# Patient Record
Sex: Male | Born: 1985
Health system: Southern US, Community
[De-identification: ages and names within clinical notes are randomized; demographics above are authoritative.]

## PROBLEM LIST (undated history)

## (undated) DIAGNOSIS — J45909 Unspecified asthma, uncomplicated: Secondary | ICD-10-CM

---

## 2015-02-26 ENCOUNTER — Encounter (HOSPITAL_BASED_OUTPATIENT_CLINIC_OR_DEPARTMENT_OTHER): Payer: Self-pay

## 2015-02-26 ENCOUNTER — Emergency Department (HOSPITAL_BASED_OUTPATIENT_CLINIC_OR_DEPARTMENT_OTHER)
Admission: EM | Admit: 2015-02-26 | Discharge: 2015-02-26 | Disposition: A | Discharge status: Home / Self Care | Payer: BLUE CROSS/BLUE SHIELD | Attending: Emergency Medicine | Admitting: Emergency Medicine

## 2015-02-26 ENCOUNTER — Emergency Department (HOSPITAL_BASED_OUTPATIENT_CLINIC_OR_DEPARTMENT_OTHER): Payer: BLUE CROSS/BLUE SHIELD

## 2015-02-26 DIAGNOSIS — J45909 Unspecified asthma, uncomplicated: Secondary | ICD-10-CM | POA: Insufficient documentation

## 2015-02-26 DIAGNOSIS — M549 Dorsalgia, unspecified: Secondary | ICD-10-CM

## 2015-02-26 DIAGNOSIS — M546 Pain in thoracic spine: Secondary | ICD-10-CM | POA: Insufficient documentation

## 2015-02-26 DIAGNOSIS — Z720 Tobacco use: Secondary | ICD-10-CM | POA: Diagnosis not present

## 2015-02-26 DIAGNOSIS — M545 Low back pain, unspecified: Secondary | ICD-10-CM

## 2015-02-26 DIAGNOSIS — R079 Chest pain, unspecified: Secondary | ICD-10-CM | POA: Diagnosis not present

## 2015-02-26 HISTORY — DX: Unspecified asthma, uncomplicated: J45.909

## 2015-02-26 MED ORDER — NAPROXEN 250 MG PO TABS
500.0000 mg | ORAL_TABLET | Freq: Once | ORAL | Status: AC
  Administered 2015-02-26: 500 mg via ORAL
  Filled 2015-02-26: qty 2

## 2015-02-26 MED ORDER — NAPROXEN 500 MG PO TABS: 500.0000 mg | ORAL_TABLET | Freq: Two times a day (BID) | ORAL | Status: DC

## 2015-02-26 MED ORDER — CYCLOBENZAPRINE HCL 10 MG PO TABS: 10.0000 mg | ORAL_TABLET | Freq: Two times a day (BID) | ORAL | Status: DC | PRN

## 2015-02-26 NOTE — Discharge Instructions (Signed)
Take medications as directed. Rest for the next few days. Work note provided. Return for any new or worse symptoms.

## 2015-02-26 NOTE — ED Notes (Addendum)
Pt C/O of upper back pain for the past few days, radiates to shoulder/ side. Pain 8/10. Reports SOB

## 2015-02-26 NOTE — ED Notes (Signed)
Directed to pharmacy to pick up meds 

## 2015-02-26 NOTE — ED Provider Notes (Signed)
CSN: [id number]     Arrival date & time 02/26/15  1559 History   First MD Initiated Contact with Patient 02/26/15 1603     Chief Complaint  Patient presents with  . Back Pain     (Consider location/radiation/quality/duration/timing/severity/associated sxs/prior Treatment) Patient is a 29 y.o. male presenting with back pain. The history is provided by the patient.  Back Pain Associated symptoms: chest pain   Associated symptoms: no abdominal pain, no dysuria, no fever, no headaches, no numbness and no weakness    patient with complaint of a few day history of bilateral lower back pain no radiation into the legs no neurological changes. Also with pain to the medial aspect of the left scapula. Both together pain is about 8 out of 10 patient mentioned shortness of breath but no hypoxia and no tachycardia. No fevers. No history of any injury. No history of anything similar in the past. Patient's of left shoulder otherwise works well able to raise his arm above his head.  Past Medical History  Diagnosis Date  . Asthma    History reviewed. No pertinent past surgical history. History reviewed. No pertinent family history. Social History  Substance Use Topics  . Smoking status: Current Some Day Smoker  . Smokeless tobacco: None  . Alcohol Use: Yes    Review of Systems  Constitutional: Negative for fever.  HENT: Negative for congestion.   Eyes: Negative for visual disturbance.  Respiratory: Negative for shortness of breath.   Cardiovascular: Positive for chest pain.  Gastrointestinal: Negative for nausea, vomiting and abdominal pain.  Genitourinary: Negative for dysuria.  Musculoskeletal: Positive for back pain.  Skin: Negative for rash.  Neurological: Negative for weakness, numbness and headaches.  Hematological: Does not bruise/bleed easily.  Psychiatric/Behavioral: Negative for confusion.      Allergies  Review of patient's allergies indicates not on file.  Home Medications    Prior to Admission medications   Medication Sig Start Date End Date Taking? Authorizing Provider  cyclobenzaprine (FLEXERIL) 10 MG tablet Take 1 tablet (10 mg total) by mouth 2 (two) times daily as needed for muscle spasms. 02/26/15   Vanetta Mulders, MD  naproxen (NAPROSYN) 500 MG tablet Take 1 tablet (500 mg total) by mouth 2 (two) times daily. 02/26/15   Vanetta Mulders, MD   BP 128/67 mmHg  Pulse 68  Temp(Src) 98.2 F (36.8 C) (Oral)  Resp 18  Ht 5\' 9"  (1.753 m)  Wt 135 lb (61.236 kg)  BMI 19.93 kg/m2  SpO2 97% Physical Exam  Constitutional: He is oriented to person, place, and time. He appears well-developed and well-nourished. No distress.  HENT:  Head: Normocephalic and atraumatic.  Eyes: Conjunctivae and EOM are normal. Pupils are equal, round, and reactive to light.  Neck: Normal range of motion. Neck supple.  Cardiovascular: Normal rate, regular rhythm and normal heart sounds.   No murmur heard. Pulmonary/Chest: Effort normal and breath sounds normal. No respiratory distress.  Abdominal: Soft. Bowel sounds are normal. There is no tenderness.  Musculoskeletal: Normal range of motion. He exhibits tenderness.  Tender to palpation to the medial aspect of the left scapula. But no muscle spasm. No point tenderness along the thoracic or lumbar spine. No tenderness to palpation of the lumbar paraspinous muscles.  Neurological: He is alert and oriented to person, place, and time. No cranial nerve deficit. He exhibits normal muscle tone. Coordination normal.  Skin: Skin is warm. No rash noted.  Nursing note and vitals reviewed.   ED Course  Procedures (including critical care time) Labs Review Labs Reviewed - No data to display  Imaging Review Dg Chest 2 View  02/26/2015   CLINICAL DATA:  Complains of upper back and upper chest pain for few days. No injury.  EXAM: CHEST  2 VIEW  COMPARISON:  None.  FINDINGS: The heart size and mediastinal contours are within normal limits.  Both lungs are clear. The visualized skeletal structures are unremarkable.  IMPRESSION: No active cardiopulmonary disease.   Electronically Signed   By: Norva Pavlov M.D.   On: 02/26/2015 17:03   I have personally reviewed and evaluated these images and lab results as part of my medical decision-making.   EKG Interpretation None      MDM   Final diagnoses:  Bilateral low back pain without sciatica  Upper back pain on left side    Patient with a few day history of bilateral paraspinous lumbar back pain without any neuro focal deficits. No history of any injury. Also pain around the left upper back around the shoulder blade also without any injury. Chest x-ray was done just to be sure that the lungs were normal. Patient without any hypoxia no tachycardia no concerns for pulmonary embolus in that area. Chest x-ray was negative. Will treat with Flexeril and Naprosyn. Work note provided.    Vanetta Mulders, MD 02/26/15 [phone]

## 2015-08-20 ENCOUNTER — Emergency Department (HOSPITAL_BASED_OUTPATIENT_CLINIC_OR_DEPARTMENT_OTHER): Payer: BLUE CROSS/BLUE SHIELD

## 2015-08-20 ENCOUNTER — Emergency Department (HOSPITAL_BASED_OUTPATIENT_CLINIC_OR_DEPARTMENT_OTHER)
Admission: EM | Admit: 2015-08-20 | Discharge: 2015-08-20 | Disposition: A | Discharge status: Home / Self Care | Payer: BLUE CROSS/BLUE SHIELD | Attending: Emergency Medicine | Admitting: Emergency Medicine

## 2015-08-20 ENCOUNTER — Encounter (HOSPITAL_BASED_OUTPATIENT_CLINIC_OR_DEPARTMENT_OTHER): Payer: Self-pay | Admitting: Emergency Medicine

## 2015-08-20 DIAGNOSIS — J45901 Unspecified asthma with (acute) exacerbation: Secondary | ICD-10-CM | POA: Insufficient documentation

## 2015-08-20 DIAGNOSIS — Z791 Long term (current) use of non-steroidal anti-inflammatories (NSAID): Secondary | ICD-10-CM | POA: Insufficient documentation

## 2015-08-20 DIAGNOSIS — R0602 Shortness of breath: Secondary | ICD-10-CM | POA: Diagnosis present

## 2015-08-20 DIAGNOSIS — F17200 Nicotine dependence, unspecified, uncomplicated: Secondary | ICD-10-CM | POA: Insufficient documentation

## 2015-08-20 DIAGNOSIS — J209 Acute bronchitis, unspecified: Secondary | ICD-10-CM

## 2015-08-20 MED ORDER — ALBUTEROL SULFATE HFA 108 (90 BASE) MCG/ACT IN AERS: 2.0000 | INHALATION_SPRAY | RESPIRATORY_TRACT | Status: DC | PRN

## 2015-08-20 MED ORDER — IPRATROPIUM-ALBUTEROL 0.5-2.5 (3) MG/3ML IN SOLN
3.0000 mL | Freq: Once | RESPIRATORY_TRACT | Status: AC
  Administered 2015-08-20: 3 mL via RESPIRATORY_TRACT

## 2015-08-20 MED ORDER — IPRATROPIUM-ALBUTEROL 0.5-2.5 (3) MG/3ML IN SOLN
RESPIRATORY_TRACT | Status: AC
  Administered 2015-08-20: 3 mL via RESPIRATORY_TRACT
  Filled 2015-08-20: qty 3

## 2015-08-20 MED ORDER — ALBUTEROL SULFATE (2.5 MG/3ML) 0.083% IN NEBU
2.5000 mg | INHALATION_SOLUTION | Freq: Once | RESPIRATORY_TRACT | Status: AC
  Administered 2015-08-20: 2.5 mg via RESPIRATORY_TRACT

## 2015-08-20 MED ORDER — ALBUTEROL SULFATE HFA 108 (90 BASE) MCG/ACT IN AERS
2.0000 | INHALATION_SPRAY | Freq: Once | RESPIRATORY_TRACT | Status: AC
  Administered 2015-08-20: 2 via RESPIRATORY_TRACT
  Filled 2015-08-20: qty 6.7

## 2015-08-20 MED ORDER — DEXAMETHASONE 4 MG PO TABS
10.0000 mg | ORAL_TABLET | Freq: Once | ORAL | Status: AC
  Administered 2015-08-20: 10 mg via ORAL
  Filled 2015-08-20: qty 1

## 2015-08-20 MED ORDER — ALBUTEROL SULFATE (2.5 MG/3ML) 0.083% IN NEBU
INHALATION_SOLUTION | RESPIRATORY_TRACT | Status: AC
  Administered 2015-08-20: 2.5 mg via RESPIRATORY_TRACT
  Filled 2015-08-20: qty 3

## 2015-08-20 NOTE — ED Provider Notes (Signed)
CSN: [id number]     Arrival date & time 08/20/15  7829 History   First MD Initiated Contact with Patient 08/20/15 1002     No chief complaint on file.    (Consider location/radiation/quality/duration/timing/severity/associated sxs/prior Treatment) HPI  30 year old male presents with cough and dyspnea x 2 days. Chest occasionally hurts, mostly when coughing. Is having some sputum production. No fevers but felt hot yesterday. Denies congestion. Some sore throat when coughing. No headache. Patient states he has a history of asthma but has not had an asthma attack" years". Patient states he smokes occasionally but not daily.  Past Medical History  Diagnosis Date  . Asthma    No past surgical history on file. No family history on file. Social History  Substance Use Topics  . Smoking status: Current Some Day Smoker  . Smokeless tobacco: Not on file  . Alcohol Use: Yes    Review of Systems  Constitutional: Positive for fever (subjective).  HENT: Negative for congestion.   Respiratory: Positive for cough, chest tightness and shortness of breath.   Cardiovascular: Positive for chest pain.  All other systems reviewed and are negative.     Allergies  Review of patient's allergies indicates no known allergies.  Home Medications   Prior to Admission medications   Medication Sig Start Date End Date Taking? Authorizing Provider  cyclobenzaprine (FLEXERIL) 10 MG tablet Take 1 tablet (10 mg total) by mouth 2 (two) times daily as needed for muscle spasms. 02/26/15   Vanetta Mulders, MD  naproxen (NAPROSYN) 500 MG tablet Take 1 tablet (500 mg total) by mouth 2 (two) times daily. 02/26/15   Vanetta Mulders, MD   BP 124/76 mmHg  Pulse 66  Temp(Src) 99.1 F (37.3 C) (Oral)  Resp 16  Ht  (1.753 m)  Wt 145 lb (65.772 kg)  BMI 21.40 kg/m2  SpO2 100% Physical Exam  Constitutional: He is oriented to person, place, and time. He appears well-developed and well-nourished. No distress.   HENT:  Head: Normocephalic and atraumatic.  Right Ear: External ear normal.  Left Ear: External ear normal.  Nose: Nose normal.  Eyes: Right eye exhibits no discharge. Left eye exhibits no discharge.  Neck: Neck supple.  Cardiovascular: Normal rate, regular rhythm, normal heart sounds and intact distal pulses.   Pulmonary/Chest: Effort normal. He has wheezes (expiratory wheezes, symmetric). He exhibits no tenderness.  Speaks in full sentences, no respiratory distress  Abdominal: Soft. He exhibits no distension. There is no tenderness.  Musculoskeletal: He exhibits no edema.  Neurological: He is alert and oriented to person, place, and time.  Skin: Skin is warm and dry. He is not diaphoretic.  Nursing note and vitals reviewed.   ED Course  Procedures (including critical care time) Labs Review Labs Reviewed - No data to display  Imaging Review Dg Chest 2 View  08/20/2015  CLINICAL DATA:  30 year old male with cough congestion and wheezing for 2 days. Initial encounter. EXAM: CHEST  2 VIEW COMPARISON:  02/26/2015 FINDINGS: Upper limits of normal to mildly hyperinflated lungs. Normal cardiac size and mediastinal contours. Visualized tracheal air column is within normal limits. No pneumothorax, pulmonary edema, pleural effusion or confluent pulmonary opacity. Mildly increased perihilar interstitial markings. No osseous abnormality identified. IMPRESSION: Mild hyperinflation and increased perihilar interstitial markings. Favor viral versus reactive airway disease. Electronically Signed   By: Odessa Fleming M.D.   On: 08/20/2015 10:43   I have personally reviewed and evaluated these images and lab results as part of my  medical decision-making.   EKG Interpretation None      MDM   Final diagnoses:  Acute bronchitis, unspecified organism    Patient symptoms are consistent with an acute bronchitis. Most likely from a viral illness. He appears well here, no increased work of breathing or  hypoxia. Feels somewhat better after an albuterol nebulizer. Chest x-ray shows no pneumonia. He has a history of asthma but has not had an asthma exacerbation in several years. Given there could be a bronchospasm component from asthma, will be given one dose of Decadron. Will discharge with an albuterol inhaler and discussed supportive care is likely a viral illness. Discussed return precautions.    Pricilla Loveless, MD 08/20/15 1153

## 2015-08-20 NOTE — ED Notes (Signed)
Sob for two days.  Pt having chest pain with coughing.  Productive brown sputum. No known fever.  Some hot spells with sweats.

## 2015-08-21 NOTE — ED Notes (Signed)
Pt called requesting work note. Seen in ED yesterday, Chart reviewed by Dr. Silverio Lay and work note faxed per pt request to [phone]

## 2015-10-29 ENCOUNTER — Encounter (HOSPITAL_BASED_OUTPATIENT_CLINIC_OR_DEPARTMENT_OTHER): Payer: Self-pay | Admitting: *Deleted

## 2015-10-29 ENCOUNTER — Emergency Department (HOSPITAL_BASED_OUTPATIENT_CLINIC_OR_DEPARTMENT_OTHER)
Admission: EM | Admit: 2015-10-29 | Discharge: 2015-10-29 | Disposition: A | Discharge status: Home / Self Care | Payer: BLUE CROSS/BLUE SHIELD | Attending: Emergency Medicine | Admitting: Emergency Medicine

## 2015-10-29 DIAGNOSIS — F17210 Nicotine dependence, cigarettes, uncomplicated: Secondary | ICD-10-CM | POA: Diagnosis not present

## 2015-10-29 DIAGNOSIS — Z7951 Long term (current) use of inhaled steroids: Secondary | ICD-10-CM | POA: Diagnosis not present

## 2015-10-29 DIAGNOSIS — J069 Acute upper respiratory infection, unspecified: Secondary | ICD-10-CM | POA: Insufficient documentation

## 2015-10-29 DIAGNOSIS — J45909 Unspecified asthma, uncomplicated: Secondary | ICD-10-CM | POA: Diagnosis not present

## 2015-10-29 DIAGNOSIS — J029 Acute pharyngitis, unspecified: Secondary | ICD-10-CM | POA: Diagnosis present

## 2015-10-29 LAB — RAPID STREP SCREEN (MED CTR MEBANE ONLY): Streptococcus, Group A Screen (Direct): NEGATIVE

## 2015-10-29 MED ORDER — FLUTICASONE PROPIONATE 50 MCG/ACT NA SUSP: 2.0000 | Freq: Every day | NASAL | Status: DC

## 2015-10-29 MED ORDER — NAPROXEN 250 MG PO TABS: 250.0000 mg | ORAL_TABLET | Freq: Two times a day (BID) | ORAL | Status: DC

## 2015-10-29 MED ORDER — BENZONATATE 100 MG PO CAPS: 100.0000 mg | ORAL_CAPSULE | Freq: Three times a day (TID) | ORAL | Status: DC | PRN

## 2015-10-29 MED ORDER — CETIRIZINE HCL 10 MG PO TABS: 10.0000 mg | ORAL_TABLET | Freq: Every day | ORAL | Status: DC

## 2015-10-29 NOTE — Discharge Instructions (Signed)
Upper Respiratory Infection, Adult Most upper respiratory infections (URIs) are a viral infection of the air passages leading to the lungs. A URI affects the nose, throat, and upper air passages. The most common type of URI is nasopharyngitis and is typically referred to as "the common cold." URIs run their course and usually go away on their own. Most of the time, a URI does not require medical attention, but sometimes a bacterial infection in the upper airways can follow a viral infection. This is called a secondary infection. Sinus and middle ear infections are common types of secondary upper respiratory infections. Bacterial pneumonia can also complicate a URI. A URI can worsen asthma and chronic obstructive pulmonary disease (COPD). Sometimes, these complications can require emergency medical care and may be life threatening.  CAUSES Almost all URIs are caused by viruses. A virus is a type of germ and can spread from one person to another.  RISKS FACTORS You may be at risk for a URI if:   You smoke.   You have chronic heart or lung disease.  You have a weakened defense (immune) system.   You are very young or very old.   You have nasal allergies or asthma.  You work in crowded or poorly ventilated areas.  You work in health care facilities or schools. SIGNS AND SYMPTOMS  Symptoms typically develop 2-3 days after you come in contact with a cold virus. Most viral URIs last 7-10 days. However, viral URIs from the influenza virus (flu virus) can last 14-18 days and are typically more severe. Symptoms may include:   Runny or stuffy (congested) nose.   Sneezing.   Cough.   Sore throat.   Headache.   Fatigue.   Fever.   Loss of appetite.   Pain in your forehead, behind your eyes, and over your cheekbones (sinus pain).  Muscle aches.  DIAGNOSIS  Your health care provider may diagnose a URI by:  Physical exam.  Tests to check that your symptoms are not due to  another condition such as:  Strep throat.  Sinusitis.  Pneumonia.  Asthma. TREATMENT  A URI goes away on its own with time. It cannot be cured with medicines, but medicines may be prescribed or recommended to relieve symptoms. Medicines may help:  Reduce your fever.  Reduce your cough.  Relieve nasal congestion. HOME CARE INSTRUCTIONS   Take medicines only as directed by your health care provider.   Gargle warm saltwater or take cough drops to comfort your throat as directed by your health care provider.  Use a warm mist humidifier or inhale steam from a shower to increase air moisture. This may make it easier to breathe.  Drink enough fluid to keep your urine clear or pale yellow.   Eat soups and other clear broths and maintain good nutrition.   Rest as needed.   Return to work when your temperature has returned to normal or as your health care provider advises. You may need to stay home longer to avoid infecting others. You can also use a face mask and careful hand washing to prevent spread of the virus.  Increase the usage of your inhaler if you have asthma.   Do not use any tobacco products, including cigarettes, chewing tobacco, or electronic cigarettes. If you need help quitting, ask your health care provider. PREVENTION  The best way to protect yourself from getting a cold is to practice good hygiene.   Avoid oral or hand contact with people with cold   symptoms.   Wash your hands often if contact occurs.  There is no clear evidence that vitamin C, vitamin E, echinacea, or exercise reduces the chance of developing a cold. However, it is always recommended to get plenty of rest, exercise, and practice good nutrition.  SEEK MEDICAL CARE IF:   You are getting worse rather than better.   Your symptoms are not controlled by medicine.   You have chills.  You have worsening shortness of breath.  You have brown or red mucus.  You have yellow or brown nasal  discharge.  You have pain in your face, especially when you bend forward.  You have a fever.  You have swollen neck glands.  You have pain while swallowing.  You have white areas in the back of your throat. SEEK IMMEDIATE MEDICAL CARE IF:   You have severe or persistent:  Headache.  Ear pain.  Sinus pain.  Chest pain.  You have chronic lung disease and any of the following:  Wheezing.  Prolonged cough.  Coughing up blood.  A change in your usual mucus.  You have a stiff neck.  You have changes in your:  Vision.  Hearing.  Thinking.  Mood. MAKE SURE YOU:   Understand these instructions.  Will watch your condition.  Will get help right away if you are not doing well or get worse.   This information is not intended to replace advice given to you by your health care provider. Make sure you discuss any questions you have with your health care provider.   Document Released: 12/08/2000 Document Revised: 10/29/2014 Document Reviewed: 09/19/2013 Elsevier Interactive Patient Education 2016 [employer]  

## 2015-10-29 NOTE — ED Provider Notes (Signed)
CSN: [id number]     Arrival date & time 10/29/15  0902 History   First MD Initiated Contact with Patient 10/29/15 [phone]     Chief Complaint  Patient presents with  . Sore Throat    Derek Moreno is a 30 y.o. male who presents to the ED complaining of a sore throat, nasal congestion, post nasal drip, and coughing for the past three days. He denies fevers. He also reports his urine stream is initally "spraying" and he will get some on the sides of the toilet. He reports once he starts his urine stream it will normalize and is his normal urine stream. He denies penile pain or discharge. He denies dysuria. He has taken nothing for treatment today. He denies fevers, SOB, abdominal pain, nausea, diarrhea, dysuria, hematuria, rashes, flank pain, testicle pain, rashes.    Patient is a 30 y.o. male presenting with pharyngitis. The history is provided by the patient. No language interpreter was used.  Sore Throat Associated symptoms include congestion, coughing and a sore throat. Pertinent negatives include no abdominal pain, chest pain, chills, fever, headaches, nausea, neck pain or rash.    Past Medical History  Diagnosis Date  . Asthma    History reviewed. No pertinent past surgical history. No family history on file. Social History  Substance Use Topics  . Smoking status: Current Some Day Smoker -- 1.00 packs/day    Types: Cigarettes, Cigars  . Smokeless tobacco: Never Used  . Alcohol Use: Yes    Review of Systems  Constitutional: Negative for fever and chills.  HENT: Positive for congestion, postnasal drip, rhinorrhea and sore throat. Negative for ear discharge, ear pain and trouble swallowing.   Eyes: Negative for visual disturbance.  Respiratory: Positive for cough. Negative for shortness of breath and wheezing.   Cardiovascular: Negative for chest pain.  Gastrointestinal: Negative for nausea, abdominal pain and diarrhea.  Genitourinary: Negative for dysuria, urgency, frequency,  hematuria, flank pain, decreased urine volume, discharge, penile swelling, scrotal swelling, difficulty urinating, penile pain and testicular pain.  Musculoskeletal: Negative for back pain and neck pain.  Skin: Negative for rash.  Neurological: Negative for headaches.      Allergies  Review of patient's allergies indicates no known allergies.  Home Medications   Prior to Admission medications   Medication Sig Start Date End Date Taking? Authorizing Provider  albuterol (PROVENTIL HFA;VENTOLIN HFA) 108 (90 Base) MCG/ACT inhaler Inhale 2 puffs into the lungs every 4 (four) hours as needed for wheezing or shortness of breath. 08/20/15  Yes Pricilla Loveless, MD  benzonatate (TESSALON) 100 MG capsule Take 1 capsule (100 mg total) by mouth 3 (three) times daily as needed for cough. 10/29/15   Everlene Farrier, PA-C  cetirizine (ZYRTEC ALLERGY) 10 MG tablet Take 1 tablet (10 mg total) by mouth daily. 10/29/15   Everlene Farrier, PA-C  fluticasone (FLONASE) 50 MCG/ACT nasal spray Place 2 sprays into both nostrils daily. 10/29/15   Everlene Farrier, PA-C  naproxen (NAPROSYN) 250 MG tablet Take 1 tablet (250 mg total) by mouth 2 (two) times daily with a meal. 10/29/15   Everlene Farrier, PA-C   BP 153/92 mmHg  Pulse 62  Temp(Src) 98.2 F (36.8 C) (Oral)  Resp 18  Ht 5\' 9"  (1.753 m)  Wt 61.236 kg  BMI 19.93 kg/m2  SpO2 100% Physical Exam  Constitutional: He appears well-developed and well-nourished. No distress.  Nontoxic appearing.  HENT:  Head: Normocephalic and atraumatic.  Right Ear: External ear normal.  Left Ear: External  ear normal.  Mouth/Throat: Oropharynx is clear and moist.  Bilateral tympanic membranes are pearly-gray without erythema or loss of landmarks.  Boggy nasal turbinates bilaterally. Mild bilateral tonsillar hypertrophy without exudates. Uvula is midline without edema. No peritonsillar abscess. No trismus. No drooling.  Eyes: Conjunctivae and EOM are normal. Pupils are equal, round,  and reactive to light. Right eye exhibits no discharge. Left eye exhibits no discharge.  Neck: Normal range of motion. Neck supple. No JVD present.  Cardiovascular: Normal rate, regular rhythm, normal heart sounds and intact distal pulses.  Exam reveals no gallop and no friction rub.   No murmur heard. Pulmonary/Chest: Effort normal and breath sounds normal. No stridor. No respiratory distress. He has no wheezes. He has no rales.  Lungs are clear to auscultation bilaterally.  Abdominal: Soft. Bowel sounds are normal. He exhibits no distension. There is no tenderness. There is no guarding.  Genitourinary: Penis normal. No penile tenderness.  No penile tenderness or discharge. Meatus is open. Scrotal contents is normal. No lesions or rashes.   Musculoskeletal: He exhibits no edema or tenderness.  Lymphadenopathy:    He has no cervical adenopathy.  Neurological: He is alert. Coordination normal.  Skin: Skin is warm and dry. No rash noted. He is not diaphoretic. No erythema. No pallor.  Psychiatric: He has a normal mood and affect. His behavior is normal.  Nursing note and vitals reviewed.   ED Course  Procedures (including critical care time) Labs Review Labs Reviewed  RAPID STREP SCREEN (NOT AT [facility])  CULTURE, GROUP A STREP [facility])    Imaging Review No results found. I have personally reviewed and evaluated these lab results as part of my medical decision-making.   EKG Interpretation None     Filed Vitals:   10/29/15 0916  BP: 153/92  Pulse: 62  Temp: 98.2 F (36.8 C)  TempSrc: Oral  Resp: 18  Height:  (1.753 m)  Weight: 61.236 kg  SpO2: 100%    MDM   Meds given in ED:  Medications - No data to display  New Prescriptions   BENZONATATE (TESSALON) 100 MG CAPSULE    Take 1 capsule (100 mg total) by mouth 3 (three) times daily as needed for cough.   CETIRIZINE (ZYRTEC ALLERGY) 10 MG TABLET    Take 1 tablet (10 mg total) by mouth daily.   FLUTICASONE (FLONASE) 50  MCG/ACT NASAL SPRAY    Place 2 sprays into both nostrils daily.   NAPROXEN (NAPROSYN) 250 MG TABLET    Take 1 tablet (250 mg total) by mouth 2 (two) times daily with a meal.    Final diagnoses:  URI (upper respiratory infection)   This is a 30 y.o. male who presents to the ED complaining of a sore throat, nasal congestion, post nasal drip, and coughing for the past three days. He denies fevers. He also reports his urine stream is initally "spraying" and he will get some on the sides of the toilet. He reports once he starts his urine stream it will normalize and is his normal urine stream. He denies penile pain or discharge. He denies dysuria. He has taken nothing for treatment today. On examination the patient is afebrile nontoxic appearing. He has mild bilateral tonsillar protrusion without exudates. Boggy nasal turbinates bilaterally. Lungs are clear to auscultation bilaterally. Abdomen is soft and nontender. GU exam is unremarkable. Meatus is open. No discharge or tenderness. I suspect that the patient's meatus is sticking together briefly and then his stream normalizes.  I educated on keeping his penis clean. He is circumcised. I encouraged him to follow up with urology if his symptoms persist. Rapid strep is negative. Patient with upper respiratory infection. We'll discharge with prescriptions for Tessalon Perles, Zyrtec, Flonase and naproxen. I encouraged him to push fluids. I discussed return precautions. I advised the patient to follow-up with their primary care provider this week. I advised the patient to return to the emergency department with new or worsening symptoms or new concerns. The patient verbalized understanding and agreement with plan.      Everlene FarrierWilliam Kamaljit Hizer, PA-C 10/29/15 1024  Marily MemosJason Mesner, MD 10/30/15 1520

## 2015-10-29 NOTE — ED Notes (Signed)
Patient states he has a three day history of sinus congestion, sore throat, and productive cough with greenish secretions.  Unsure of a temperature.

## 2015-11-01 LAB — CULTURE, GROUP A STREP (THRC)

## 2015-11-12 ENCOUNTER — Emergency Department (HOSPITAL_BASED_OUTPATIENT_CLINIC_OR_DEPARTMENT_OTHER): Payer: BLUE CROSS/BLUE SHIELD

## 2015-11-12 ENCOUNTER — Emergency Department (HOSPITAL_BASED_OUTPATIENT_CLINIC_OR_DEPARTMENT_OTHER)
Admission: EM | Admit: 2015-11-12 | Discharge: 2015-11-13 | Disposition: A | Discharge status: Other Acute Inpatient Hospital | Payer: BLUE CROSS/BLUE SHIELD | Attending: Emergency Medicine | Admitting: Emergency Medicine

## 2015-11-12 ENCOUNTER — Encounter (HOSPITAL_BASED_OUTPATIENT_CLINIC_OR_DEPARTMENT_OTHER): Payer: Self-pay | Admitting: Emergency Medicine

## 2015-11-12 DIAGNOSIS — F17210 Nicotine dependence, cigarettes, uncomplicated: Secondary | ICD-10-CM | POA: Diagnosis not present

## 2015-11-12 DIAGNOSIS — R10A1 Flank pain, right side: Secondary | ICD-10-CM | POA: Diagnosis present

## 2015-11-12 DIAGNOSIS — R1031 Right lower quadrant pain: Secondary | ICD-10-CM | POA: Insufficient documentation

## 2015-11-12 DIAGNOSIS — K389 Disease of appendix, unspecified: Secondary | ICD-10-CM | POA: Diagnosis present

## 2015-11-12 DIAGNOSIS — J45909 Unspecified asthma, uncomplicated: Secondary | ICD-10-CM | POA: Diagnosis not present

## 2015-11-12 DIAGNOSIS — R109 Unspecified abdominal pain: Secondary | ICD-10-CM | POA: Diagnosis present

## 2015-11-12 DIAGNOSIS — R300 Dysuria: Secondary | ICD-10-CM | POA: Diagnosis not present

## 2015-11-12 DIAGNOSIS — R319 Hematuria, unspecified: Secondary | ICD-10-CM | POA: Insufficient documentation

## 2015-11-12 DIAGNOSIS — M79651 Pain in right thigh: Secondary | ICD-10-CM | POA: Diagnosis present

## 2015-11-12 DIAGNOSIS — Z720 Tobacco use: Secondary | ICD-10-CM | POA: Diagnosis present

## 2015-11-12 LAB — BASIC METABOLIC PANEL
Anion gap: 9 (ref 5–15)
BUN: 12 mg/dL (ref 6–20)
CHLORIDE: 101 mmol/L (ref 101–111)
CO2: 26 mmol/L (ref 22–32)
CREATININE: 1.12 mg/dL (ref 0.61–1.24)
Calcium: 9.1 mg/dL (ref 8.9–10.3)
GFR calc non Af Amer: >60 mL/min (ref >60)
Glucose, Bld: 78 mg/dL (ref 65–99)
POTASSIUM: 3.6 mmol/L (ref 3.5–5.1)
SODIUM: 136 mmol/L (ref 135–145)

## 2015-11-12 LAB — CBC WITH DIFFERENTIAL/PLATELET
BASOS ABS: 0 10*3/uL (ref 0.0–0.1)
Basophils Relative: 0 %
EOS ABS: 0.1 10*3/uL (ref 0.0–0.7)
Eosinophils Relative: 2 %
HCT: 40.2 % (ref 39.0–52.0)
HEMOGLOBIN: 13.6 g/dL (ref 13.0–17.0)
LYMPHS ABS: 2.7 10*3/uL (ref 0.7–4.0)
Lymphocytes Relative: 32 %
MCH: 28.8 pg (ref 26.0–34.0)
MCHC: 33.8 g/dL (ref 30.0–36.0)
MCV: 85 fL (ref 78.0–100.0)
Monocytes Absolute: 0.7 10*3/uL (ref 0.1–1.0)
Monocytes Relative: 8 %
NEUTROS PCT: 58 %
Neutro Abs: 5 10*3/uL (ref 1.7–7.7)
Platelets: 279 10*3/uL (ref 150–400)
RBC: 4.73 MIL/uL (ref 4.22–5.81)
RDW: 13.5 % (ref 11.5–15.5)
WBC: 8.6 10*3/uL (ref 4.0–10.5)

## 2015-11-12 LAB — URINALYSIS, ROUTINE W REFLEX MICROSCOPIC
BILIRUBIN URINE: NEGATIVE
GLUCOSE, UA: NEGATIVE mg/dL
KETONES UR: NEGATIVE mg/dL
Leukocytes, UA: NEGATIVE
Nitrite: NEGATIVE
PH: 6 (ref 5.0–8.0)
Protein, ur: NEGATIVE mg/dL
Specific Gravity, Urine: 1.04 — ABNORMAL HIGH (ref 1.005–1.030)

## 2015-11-12 LAB — URINE MICROSCOPIC-ADD ON

## 2015-11-12 MED ORDER — OXYCODONE-ACETAMINOPHEN 5-325 MG PO TABS: 1.0000 | ORAL_TABLET | Freq: Four times a day (QID) | ORAL | Status: DC | PRN

## 2015-11-12 MED ORDER — KETOROLAC TROMETHAMINE 30 MG/ML IJ SOLN
30.0000 mg | Freq: Once | INTRAMUSCULAR | Status: AC
  Administered 2015-11-12: 30 mg via INTRAVENOUS
  Filled 2015-11-12: qty 1

## 2015-11-12 MED ORDER — FENTANYL CITRATE (PF) 100 MCG/2ML IJ SOLN
50.0000 ug | Freq: Once | INTRAMUSCULAR | Status: AC
  Administered 2015-11-12: 50 ug via INTRAVENOUS
  Filled 2015-11-12: qty 2

## 2015-11-12 NOTE — Discharge Instructions (Signed)
Managing Pain  Pain after surgery or related to activity is often due to strain/injury to muscle, tendon, nerves and/or incisions.  This pain is usually short-term and will improve in a few months.   Many people find it helpful to do the following things TOGETHER to help speed the process of healing and to get back to regular activity more quickly:  1. Avoid heavy physical activity at first a. No lifting greater than 20 pounds at first, then increase to lifting as tolerated over the next few weeks b. Do not push through the pain.  Listen to your body and avoid positions and maneuvers than reproduce the pain.  Wait a few days before trying something more intense c. Walking is okay as tolerated, but go slowly and stop when getting sore.  If you can walk 30 minutes without stopping or pain, you can try more intense activity (running, jogging, aerobics, cycling, swimming, treadmill, sex, sports, weightlifting, etc ) d. Remember: If it hurts to do it, then dont do it!  2. Take Anti-inflammatory medication a. Choose ONE of the following over-the-counter medications: i.            Acetaminophen 500mg  tabs (Tylenol) 1-2 pills with every meal and just before bedtime (avoid if you have liver problems) ii.            Naproxen 220mg  tabs (ex. Aleve) 1-2 pills twice a day (avoid if you have kidney, stomach, IBD, or bleeding problems) iii. Ibuprofen 200mg  tabs (ex. Advil, Motrin) 3-4 pills with every meal and just before bedtime (avoid if you have kidney, stomach, IBD, or bleeding problems) b. Take with food/snack around the clock for 1-2 weeks i. This helps the muscle and nerve tissues become less irritable and calm down faster  3. Use a Heating pad or Ice/Cold Pack a. 4-6 times a day b. May use warm bath/hottub  or showers  4. Try Gentle Massage and/or Stretching  a. at the area of pain many times a day b. stop if you feel pain - do not overdo it  Try these steps together to help you body heal  faster and avoid making things get worse.  Doing just one of these things may not be enough.    If you are not getting better after two weeks or are noticing you are getting worse, contact our office for further advice; we may need to re-evaluate you & see what other things we can do to help.   Flank Pain Flank pain refers to pain that is located on the side of the body between the upper abdomen and the back. The pain may occur over a short period of time (acute) or may be long-term or reoccurring (chronic). It may be mild or severe. Flank pain can be caused by many things. CAUSES  Some of the more common causes of flank pain include:  Muscle strains.   Muscle spasms.   A disease of your spine (vertebral disk disease).   A lung infection (pneumonia).   Fluid around your lungs (pulmonary edema).   A kidney infection.   Kidney stones.   A very painful skin rash caused by the chickenpox virus (shingles).   Gallbladder disease.  HOME CARE INSTRUCTIONS  Home care will depend on the cause of your pain. In general,  Rest as directed by your caregiver.  Drink enough fluids to keep your urine clear or pale yellow.  Only take over-the-counter or prescription medicines as directed by your caregiver. Some medicines  may help relieve the pain.  Tell your caregiver about any changes in your pain.  Follow up with your caregiver as directed. SEEK IMMEDIATE MEDICAL CARE IF:   Your pain is not controlled with medicine.   You have new or worsening symptoms.  Your pain increases.   You have abdominal pain.   You have shortness of breath.   You have persistent nausea or vomiting.   You have swelling in your abdomen.   You feel faint or pass out.   You have blood in your urine.  You have a fever or persistent symptoms for more than 2-3 days.  You have a fever and your symptoms suddenly get worse. MAKE SURE YOU:   Understand these instructions.  Will watch  your condition.  Will get help right away if you are not doing well or get worse.   This information is not intended to replace advice given to you by your health care provider. Make sure you discuss any questions you have with your health care provider.   Document Released: 08/05/2005 Document Revised: 03/08/2012 Document Reviewed: 01/27/2012 Elsevier Interactive Patient Education 2016 [employer]  Hematuria, Adult Hematuria is blood in your urine. It can be caused by a bladder infection, kidney infection, prostate infection, kidney stone, or cancer of your urinary tract. Infections can usually be treated with medicine, and a kidney stone usually will pass through your urine. If neither of these is the cause of your hematuria, further workup to find out the reason may be needed. It is very important that you tell your health care provider about any blood you see in your urine, even if the blood stops without treatment or happens without causing pain. Blood in your urine that happens and then stops and then happens again can be a symptom of a very serious condition. Also, pain is not a symptom in the initial stages of many urinary cancers. HOME CARE INSTRUCTIONS   Drink lots of fluid, 3-4 quarts a day. If you have been diagnosed with an infection, cranberry juice is especially recommended, in addition to large amounts of water.  Avoid caffeine, tea, and carbonated beverages because they tend to irritate the bladder.  Avoid alcohol because it may irritate the prostate.  Take all medicines as directed by your health care provider.  If you were prescribed an antibiotic medicine, finish it all even if you start to feel better.  If you have been diagnosed with a kidney stone, follow your health care provider's instructions regarding straining your urine to catch the stone.  Empty your bladder often. Avoid holding urine for long periods of time.  After a bowel movement, women should cleanse  front to back. Use each tissue only once.  Empty your bladder before and after sexual intercourse if you are a male. SEEK MEDICAL CARE IF:  You develop back pain.  You have a fever.  You have a feeling of sickness in your stomach (nausea) or vomiting.  Your symptoms are not better in 3 days. Return sooner if you are getting worse. SEEK IMMEDIATE MEDICAL CARE IF:   You develop severe vomiting and are unable to keep the medicine down.  You develop severe back or abdominal pain despite taking your medicines.  You begin passing a large amount of blood or clots in your urine.  You feel extremely weak or faint, or you pass out. MAKE SURE YOU:   Understand these instructions.  Will watch your condition.  Will get help right away  if you are not doing well or get worse.   This information is not intended to replace advice given to you by your health care provider. Make sure you discuss any questions you have with your health care provider.   Document Released: 06/14/2005 Document Revised: 07/05/2014 Document Reviewed: 02/12/2013 Elsevier Interactive Patient Education [employer]

## 2015-11-12 NOTE — ED Provider Notes (Addendum)
  Physical Exam  BP 134/92 mmHg  Pulse 67  Temp(Src) 97.8 F (36.6 C) (Oral)  Resp 18  Ht 5\' 9"  (1.753 m)  Wt 145 lb (65.772 kg)  BMI 21.40 kg/m2  SpO2 98%  Physical Exam  ED Course  Procedures  MDM Transferred from [facility] to see Dr. gross from general surgery. CT scan with some mild appendix changes. Patient still complaining of pain but not very tender at this time. To be seen in the ER by general surgery.      Benjiman CoreNathan Toran Murch, MD 11/12/15 2241  Seen by Dr. gross who believes this is muscle skeletal pain. Did have hematuria. Will have follow-up with urology as needed. Pain medicines given here.  Benjiman CoreNathan Savir Blanke, MD 11/12/15 [phone]

## 2015-11-12 NOTE — ED Provider Notes (Signed)
CSN: [id number]     Arrival date & time 11/12/15  1737 History  By signing my name below, I, [facility] LL[facility]issa Washington, attest that this documentation has been prepared under the direction and in the presence of Geoffery Lyonsouglas Tiffinie Caillier, MD. Electronically Signed: Randell PatientMarrissa Washington, ED Scribe. 11/12/2015. 8:19 PM.   Chief Complaint  Patient presents with  . Dysuria  . Abdominal Pain   HPI Comments: HPI Comments: Derek Moreno is a 30 y.o. Male with an hx of asthma who presents to the Emergency Department complaining of constant, mild, RLQ bdominal pain onset 3 days ago. Pt states that his symptoms began suddenly without injury. He reports dysuria for the. Pain is unchanged with palpation and percussion. He notes that he is currently sexually active with one new partner. Denies hx of kidney stones. Denies similar symptoms in the apst. Denies fever, bowel incontinence, penile discharge.   Patient is a 30 y.o. male presenting with dysuria and abdominal pain. The history is provided by the patient. No language interpreter was used.  Dysuria This is a new problem. The current episode started more than 2 days ago. The problem has not changed since onset.Associated symptoms include abdominal pain. Nothing aggravates the symptoms. Nothing relieves the symptoms. He has tried nothing for the symptoms.  Abdominal Pain Pain location:  R flank Pain radiates to:  Does not radiate Pain severity:  Mild Onset quality:  Gradual Timing:  Constant Progression:  Unchanged Chronicity:  New Context: recent sexual activity   Relieved by:  None tried Worsened by:  Nothing tried Associated symptoms: dysuria   Associated symptoms: no fever     Past Medical History  Diagnosis Date  . Asthma    History reviewed. No pertinent past surgical history. History reviewed. No pertinent family history. Social History  Substance Use Topics  . Smoking status: Current Some Day Smoker -- 1.00 packs/day    Types: Cigarettes, Cigars  .  Smokeless tobacco: Never Used  . Alcohol Use: Yes    Review of Systems  Constitutional: Negative for fever.  Gastrointestinal: Positive for abdominal pain.  Genitourinary: Positive for dysuria and flank pain. Negative for discharge.  All other systems reviewed and are negative.  Allergies  Review of patient's allergies indicates no known allergies.  Home Medications   Prior to Admission medications   Medication Sig Start Date End Date Taking? Authorizing Provider  albuterol (PROVENTIL HFA;VENTOLIN HFA) 108 (90 Base) MCG/ACT inhaler Inhale 2 puffs into the lungs every 4 (four) hours as needed for wheezing or shortness of breath. 08/20/15   Pricilla LovelessScott Goldston, MD  benzonatate (TESSALON) 100 MG capsule Take 1 capsule (100 mg total) by mouth 3 (three) times daily as needed for cough. 10/29/15   Everlene FarrierWilliam Dansie, PA-C  cetirizine (ZYRTEC ALLERGY) 10 MG tablet Take 1 tablet (10 mg total) by mouth daily. 10/29/15   Everlene FarrierWilliam Dansie, PA-C  fluticasone (FLONASE) 50 MCG/ACT nasal spray Place 2 sprays into both nostrils daily. 10/29/15   Everlene FarrierWilliam Dansie, PA-C  naproxen (NAPROSYN) 250 MG tablet Take 1 tablet (250 mg total) by mouth 2 (two) times daily with a meal. 10/29/15   Everlene FarrierWilliam Dansie, PA-C   BP 134/92 mmHg  Pulse 67  Temp(Src) 97.8 F (36.6 C) (Oral)  Resp 18  Ht 5\' 9"  (1.753 m)  Wt 145 lb (65.772 kg)  BMI 21.40 kg/m2  SpO2 98% Physical Exam  Constitutional: He is oriented to person, place, and time. He appears well-developed and well-nourished. No distress.  HENT:  Head: Normocephalic and atraumatic.  Eyes: Conjunctivae are normal.  Neck: Normal range of motion.  Cardiovascular: Normal rate.   Pulmonary/Chest: Effort normal. No respiratory distress.  Abdominal: Soft. Bowel sounds are normal. He exhibits no distension. There is no tenderness.  Musculoskeletal: Normal range of motion.  Neurological: He is alert and oriented to person, place, and time.  Skin: Skin is warm and dry.  Psychiatric: He  has a normal mood and affect. His behavior is normal.  Nursing note and vitals reviewed.   ED Course  Procedures  DIAGNOSTIC STUDIES: Oxygen Saturation is 100% on RA, normal by my interpretation.    COORDINATION OF CARE: 6:06 PM Will return to discuss results of labs. Discussed treatment plan with pt at bedside and pt agreed to plan.  6:40 PM Reviewed lab results. Will order renal stone study CT. Will return to inform pt of treatment plan.  7:37 PM Returned to discuss results of CT imaging. Ordered more labs and will consult with General Surgery.  8:17 PM Consulted with General Surgery.   Labs Review Labs Reviewed  URINALYSIS, ROUTINE W REFLEX MICROSCOPIC (NOT AT [facility]) - Abnormal; Notable for the following:    Specific Gravity, Urine 1.040 (*)    Hgb urine dipstick MODERATE (*)    All other components within normal limits  URINE MICROSCOPIC-ADD ON - Abnormal; Notable for the following:    Squamous Epithelial / LPF 0-5 (*)    Bacteria, UA RARE (*)    All other components within normal limits  BASIC METABOLIC PANEL  CBC WITH DIFFERENTIAL/PLATELET    Imaging Review Ct Renal Stone Study  11/12/2015  CLINICAL DATA:  RIGHT flank pain for 3 days, dysuria, asthma, smoker, initial encounter EXAM: CT ABDOMEN AND PELVIS WITHOUT CONTRAST TECHNIQUE: Multidetector CT imaging of the abdomen and pelvis was performed following the standard protocol without IV contrast. Sagittal and coronal MPR images reconstructed from axial data set. Oral contrast not administered for this indication. COMPARISON:  None FINDINGS: Lung bases clear. Kidneys unremarkable. No urinary tract calcification, hydronephrosis or ureteral dilatation. Bladder decompressed. Within limits of noncontrast technique, no focal abnormalities of the liver, gallbladder, spleen, pancreas, kidneys or adrenal glands. Appendicolith within appendix with distal appendix prominent size at 7-8 mm diameter; however no definite periappendiceal  inflammatory changes are identified. Stomach and bowel loops otherwise normal appearance for noncontrast technique. No mass, adenopathy, free air, free fluid or hernia. Osseous structures normal appearance. IMPRESSION: No evidence of urinary tract calcification or dilatation. Appendicolith within appendix with distal mild enlargement of the appendix measuring 7-8 mm diameter ; while no definite periappendiceal inflammatory changes are identified, would recommend clinical exclusion of appendicitis. Remainder of exam unremarkable. Electronically Signed   By: Ulyses Southward M.D.   On: 11/12/2015 19:10   I have personally reviewed and evaluated these images and lab results as part of my medical decision-making.    MDM   Final diagnoses:  Right flank pain    Patient presents with right side pain and dysuria. His urinalysis reveals blood. He was sent for a CT to rule out a kidney stone. This did not reveal a kidney stone, however did reveal an appendicolith and an enlarged appendix. He has no fever or white count so I'm uncertain as to what to make of this CT finding. I've discussed this with Dr. Michaell Cowing from general surgery who was advised me to send the patient to the ER at [facility] where he can examine him and determine the appropriate course of action. I've spoken with Dr.  Pickering in the emergency department who accepts the patient in transfer.  I personally performed the services described in this documentation, which was scribed in my presence. The recorded information has been reviewed and is accurate.       Geoffery Lyons, MD 11/12/15 2022

## 2015-11-12 NOTE — ED Notes (Signed)
Pt in c/o R sided abd pain and dysuria onset a few days ago with dysuria. Ambulatory in NAD.

## 2015-11-12 NOTE — Consult Note (Addendum)
[name]  [address] [place], [place] [address] Phone: [phone] FAX: [place]  1985/08/04 [id number]  CARE TEAM:  PCP: No PCP Per Patient  Outpatient Care Team: Patient Care Team: No Pcp Per Patient as PCP - General (General Practice)  Inpatient Treatment Team: Treatment Team: Attending Provider: Veryl Speak, MD; Technician: Domenick Bookbinder, NT; Registered Nurse: Laurina Bustle, RN; Consulting Physician: Nolon Nations, MD  This patient is a 30 y.o.male who presents today for surgical evaluation at the request of Dr Stark Jock, [facility].   Reason for evaluation: Right lower abdominal and thigh pain with appendicolith and dysuria.  Question of early appendicitis.  Young smoking male who has a three-day history of right posterior flank pain.  An constant.  Intermittent.  Sometimes sharp.  He has had no nausea or vomiting.  Eating fine.  No diarrhea.  No sick contacts.  No change in diet.  No fall.  No trauma.  No strain.  Did have an upper respiratory infection with a lot of coughing a few weeks ago.  Patient does not seem to get worse pain with cough for the car ride coming over.  Persistent.  Frustrating.  Went to emergency room.  No fever.  No leukocytosis.  No tachycardia.  Minimal hemoglobin in the blood but no strong suspicion for UTI.  CT scan done.  Appendicolith noted.  Appendix upper limits of normal.  No evidence of appendicitis or inflammation though.  Surgical consultation requested though to make sure patient does not have appendicitis since rest of differential diagnosis seems to be ruled out.  Patient sitting quietly in his room.  One male listening attentively.  The other one talking on the phone to someone else during most of my consultation.  No personal nor family history of GI/colon cancer, inflammatory bowel disease, irritable bowel syndrome, allergy such as Celiac Sprue, dietary/dairy  problems, colitis, ulcers nor gastritis.  No recent sick contacts/gastroenteritis.  No travel outside the country.  No changes in diet.  No dysphagia to solids or liquids.  No significant heartburn or reflux.  No hematochezia, hematemesis, coffee ground emesis.  No evidence of prior gastric/peptic ulceration.  No problems walking activity.  No history of heart attack or strokes.  He smokes less than a half a pack of cigarettes a day.    Past Medical History  Diagnosis Date  . Asthma     History reviewed. No pertinent past surgical history.  Social History   Social History  . Marital Status: Single    Spouse Name: N/A  . Number of Children: N/A  . Years of Education: N/A   Occupational History  . Not on file.   Social History Main Topics  . Smoking status: Current Some Day Smoker -- 1.00 packs/day    Types: Cigarettes, Cigars  . Smokeless tobacco: Never Used  . Alcohol Use: Yes  . Drug Use: Not on file  . Sexual Activity: Not on file   Other Topics Concern  . Not on file   Social History Narrative    History reviewed. No pertinent family history.  No current facility-administered medications for this encounter.   Current Outpatient Prescriptions  Medication Sig Dispense Refill  . albuterol (PROVENTIL HFA;VENTOLIN HFA) 108 (90 Base) MCG/ACT inhaler Inhale 2 puffs into the lungs every 4 (four) hours as needed for wheezing or shortness of breath. 1 Inhaler 0  . benzonatate (TESSALON) 100  MG capsule Take 1 capsule (100 mg total) by mouth 3 (three) times daily as needed for cough. 21 capsule 0  . cetirizine (ZYRTEC ALLERGY) 10 MG tablet Take 1 tablet (10 mg total) by mouth daily. 30 tablet 1  . fluticasone (FLONASE) 50 MCG/ACT nasal spray Place 2 sprays into both nostrils daily. 16 g 0  . naproxen (NAPROSYN) 250 MG tablet Take 1 tablet (250 mg total) by mouth 2 (two) times daily with a meal. 30 tablet 0     No Known Allergies  ROS: Constitutional:  No fevers, chills,  sweats.  Weight stable Eyes:  No vision changes, No discharge HENT:  No sore throats, nasal drainage Lymph: No neck swelling, No bruising easily Pulmonary:  No cough, productive sputum CV: No orthopnea, PND  Patient walks 30 minutes for about 2 miles without difficulty.  No exertional chest/neck/shoulder/arm pain. GI:  No personal nor family history of GI/colon cancer, inflammatory bowel disease, irritable bowel syndrome, allergy such as Celiac Sprue, dietary/dairy problems, colitis, ulcers nor gastritis.  No recent sick contacts/gastroenteritis.  No travel outside the country.  No changes in diet. Renal: No UTIs, No hematuria.  Question of dysuria before but he denies it to me.  No pain with urination.  No pyuria.  No hematuria. Genital:  No drainage, bleeding, masses.  Sexually active but no discharge.  No sores or lesions. Musculoskeletal: No severe joint pain.  Good ROM major joints Skin:  No sores or lesions.  No rashes Heme/Lymph:  No easy bleeding.  No swollen lymph nodes Neuro: No focal weakness/numbness.  No seizures Psych: No suicidal ideation.  No hallucinations  BP 134/92 mmHg  Pulse 67  Temp(Src) 97.8 F (36.6 C) (Oral)  Resp 18  Ht _0  (1.753 m)  Wt 65.772 kg (145 lb)  BMI 21.40 kg/m2  SpO2 98%  Physical Exam: General: Pt awake/alert/oriented x4 in no major acute distress.  Thin.  Muscular.  Not cachectic.  Not sickly.  Not toxic. Eyes: PERRL, normal EOM. Sclera nonicteric Neuro: CN II-XII intact w/o focal sensory/motor deficits. Lymph: No head/neck/groin lymphadenopathy Psych:  No delerium/psychosis/paranoia.  Quiet and soft spoken at first.  However more frustrated and angry by the end of the visit. HENT: Normocephalic, Mucus membranes moist.  No thrush.   Neck: Supple, No tracheal deviation.  Normal active range of motion.  No Kernig sign.  No carotid bruits Chest: No pain.  Good respiratory excursion.  Clear to auscultation bilaterally.  No wheezes rales or  rhonchi. CV:  Pulses intact.  Regular rhythm.  I hear no murmurs nor clicks Abdomen: Soft, Nondistended.  Mild soreness on right posterior flank along mid and posterior axillary line.  No pain over McBurney's point.  No guarding.  No Rosnick sign.  No psoas sign.  No reproduction of pain with bed shake nor cough.  No evidence of peritonitis.  No diastases.  No incisions.  No umbilical hernia.   Genital: Normal external male genitalia.  No living inguinal lymphadenopathy.  No inguinal hernias. Ext:  SCDs BLE.  No significant edema.  No cyanosis Skin: No petechiae / purpurea.  No major sores Musculoskeletal: No joint pain.  Good ROM major joints.  No pain along thoracolumbar sacral spine.   Results:   Labs: Results for orders placed or performed during the hospital encounter of 11/12/15 (from the past 48 hour(s))  Urinalysis, Routine w reflex microscopic- may I&O cath if menses     Status: Abnormal   Collection Time: 11/12/15  5:55 PM  Result Value Ref Range   Color, Urine YELLOW YELLOW   APPearance CLEAR CLEAR   Specific Gravity, Urine 1.040 (H) 1.005 - 1.030   pH 6.0 5.0 - 8.0   Glucose, UA NEGATIVE NEGATIVE mg/dL   Hgb urine dipstick MODERATE (A) NEGATIVE   Bilirubin Urine NEGATIVE NEGATIVE   Ketones, ur NEGATIVE NEGATIVE mg/dL   Protein, ur NEGATIVE NEGATIVE mg/dL   Nitrite NEGATIVE NEGATIVE   Leukocytes, UA NEGATIVE NEGATIVE  Urine microscopic-add on     Status: Abnormal   Collection Time: 11/12/15  5:55 PM  Result Value Ref Range   Squamous Epithelial / LPF 0-5 (A) NONE SEEN   WBC, UA 0-5 0 - 5 WBC/hpf   RBC / HPF 6-30 0 - 5 RBC/hpf   Bacteria, UA RARE (A) NONE SEEN   Urine-Other MUCOUS PRESENT   Basic metabolic panel     Status: None   Collection Time: 11/12/15  7:45 PM  Result Value Ref Range   Sodium 136 135 - 145 mmol/L   Potassium 3.6 3.5 - 5.1 mmol/L   Chloride 101 101 - 111 mmol/L   CO2 26 22 - 32 mmol/L   Glucose, Bld 78 65 - 99 mg/dL   BUN 12 6 - 20 mg/dL    Creatinine, Ser 1.12 0.61 - 1.24 mg/dL   Calcium 9.1 8.9 - 10.3 mg/dL   GFR calc non Af Amer >60 >60 mL/min   GFR calc Af Amer >60 >60 mL/min    Comment: (NOTE) The eGFR has been calculated using the CKD EPI equation. This calculation has not been validated in all clinical situations. eGFR's persistently <60 mL/min signify possible Chronic Kidney Disease.    Anion gap 9 5 - 15  CBC with Differential     Status: None   Collection Time: 11/12/15  7:45 PM  Result Value Ref Range   WBC 8.6 4.0 - 10.5 K/uL   RBC 4.73 4.22 - 5.81 MIL/uL   Hemoglobin 13.6 13.0 - 17.0 g/dL   HCT 40.2 39.0 - 52.0 %   MCV 85.0 78.0 - 100.0 fL   MCH 28.8 26.0 - 34.0 pg   MCHC 33.8 30.0 - 36.0 g/dL   RDW 13.5 11.5 - 15.5 %   Platelets 279 150 - 400 K/uL   Neutrophils Relative % 58 %   Neutro Abs 5.0 1.7 - 7.7 K/uL   Lymphocytes Relative 32 %   Lymphs Abs 2.7 0.7 - 4.0 K/uL   Monocytes Relative 8 %   Monocytes Absolute 0.7 0.1 - 1.0 K/uL   Eosinophils Relative 2 %   Eosinophils Absolute 0.1 0.0 - 0.7 K/uL   Basophils Relative 0 %   Basophils Absolute 0.0 0.0 - 0.1 K/uL    Imaging / Studies: Ct Renal Stone Study  11/12/2015  CLINICAL DATA:  RIGHT flank pain for 3 days, dysuria, asthma, smoker, initial encounter EXAM: CT ABDOMEN AND PELVIS WITHOUT CONTRAST TECHNIQUE: Multidetector CT imaging of the abdomen and pelvis was performed following the standard protocol without IV contrast. Sagittal and coronal MPR images reconstructed from axial data set. Oral contrast not administered for this indication. COMPARISON:  None FINDINGS: Lung bases clear. Kidneys unremarkable. No urinary tract calcification, hydronephrosis or ureteral dilatation. Bladder decompressed. Within limits of noncontrast technique, no focal abnormalities of the liver, gallbladder, spleen, pancreas, kidneys or adrenal glands. Appendicolith within appendix with distal appendix prominent size at 7-8 mm diameter; however no definite periappendiceal  inflammatory changes are identified. Stomach and bowel  loops otherwise normal appearance for noncontrast technique. No mass, adenopathy, free air, free fluid or hernia. Osseous structures normal appearance. IMPRESSION: No evidence of urinary tract calcification or dilatation. Appendicolith within appendix with distal mild enlargement of the appendix measuring 7-8 mm diameter ; while no definite periappendiceal inflammatory changes are identified, would recommend clinical exclusion of appendicitis. Remainder of exam unremarkable. Electronically Signed   By: Lavonia Dana M.D.   On: 11/12/2015 19:10    Medications / Allergies: per chart  Antibiotics: Anti-infectives    None      Assessment  Derek Moreno  30 y.o. male       Problem List:  Active Problems:   RLQ abdominal pain   Dysuria   Pain in right thigh   Appendicolith   Right flank pain of uncertain etiology.  History and physical not consistent with appendicitis.  Would make me think of pyelonephritis with there is no fever or leukocytosis.  There is no abnormal UA.  There is no kidney stone.  There is no inflammation around the kidney.  Admittedly is challenge because he is so thin there is not much fat to have any inflammation.  Appendicolith noted.  Appendix can be seen.  However no evidence of inflammation or perforation.  Would expect some more strong evidence appendicitis with 72 hours of symptoms.  No anorexia.  No nausea or vomiting.  No bowel changes.  Plan:  Did a thorough history and physical.  Review with his data many times.  Discussed with the patient and his family.  I do not think he has a surgical problem at this time.  I think the appendicolith is incidental.  Again he does not have a story for appendicitis.  The tip of the appendix is more in the right paramedian suprapubic region.  Not retrocecal on the flank.  The pain at McBurney's point.  I suspect more of a musculoskeletal issue.  I recommended heat and  anti-inflammatories round-the-clock for the next 2-3 weeks.  His family felt that was a reasonable recommendation.  The patient himself though became rather frustrated.  He noted he was in pain and he wanted something done.  I discussed with supervising emergency physician at [facility], Dr. Alvino Chapel.  Nate agrees that this does not seem like appendicitis either.  He will re-review the patient.  Most likely he will offer anti-inflammatory regimen with return if worse or not improved.  May not be unreasonable to do a short 5 day course of ciprofloxacin on the presumption of perhaps some pyelonephritis or his urinary symptoms that he confessed to other physicians but not me..  Maybe overtreating though.  Defer to Dr. Alvino Chapel.  -VTE prophylaxis- SCDs, etc -mobilize as tolerated to help recovery    Adin Hector, M.D., F.A.C.S. Gastrointestinal and Minimally Invasive Surgery [facility] [address] [place], [place] [address] [phone] Main / Paging   11/12/2015  Note: Portions of this report may have been transcribed using voice recognition software. Every effort was made to ensure accuracy; however, inadvertent computerized transcription errors may be present.   Any transcriptional errors that result from this process are unintentional.

## 2015-11-12 NOTE — ED Notes (Signed)
[facility]  [facility].  Consulting civil engineerCharge RN [facility].  Pt has no questions.  Was ambulatory out of the dept without any distress.

## 2015-11-12 NOTE — ED Notes (Signed)
Patient transported to CT 

## 2015-11-12 NOTE — ED Notes (Signed)
Bed: WA07 Expected date:  Expected time:  Means of arrival:  Comments: tx [facility]/acute appy

## 2015-11-18 ENCOUNTER — Encounter (HOSPITAL_BASED_OUTPATIENT_CLINIC_OR_DEPARTMENT_OTHER): Payer: Self-pay | Admitting: Emergency Medicine

## 2015-11-18 ENCOUNTER — Emergency Department (HOSPITAL_BASED_OUTPATIENT_CLINIC_OR_DEPARTMENT_OTHER)
Admission: EM | Admit: 2015-11-18 | Discharge: 2015-11-19 | Disposition: A | Discharge status: Home / Self Care | Payer: BLUE CROSS/BLUE SHIELD | Attending: Emergency Medicine | Admitting: Emergency Medicine

## 2015-11-18 DIAGNOSIS — J45909 Unspecified asthma, uncomplicated: Secondary | ICD-10-CM | POA: Diagnosis not present

## 2015-11-18 DIAGNOSIS — IMO0002 Reserved for concepts with insufficient information to code with codable children: Secondary | ICD-10-CM

## 2015-11-18 DIAGNOSIS — F17210 Nicotine dependence, cigarettes, uncomplicated: Secondary | ICD-10-CM | POA: Diagnosis not present

## 2015-11-18 DIAGNOSIS — Y929 Unspecified place or not applicable: Secondary | ICD-10-CM | POA: Diagnosis not present

## 2015-11-18 DIAGNOSIS — Y999 Unspecified external cause status: Secondary | ICD-10-CM | POA: Diagnosis not present

## 2015-11-18 DIAGNOSIS — Y939 Activity, unspecified: Secondary | ICD-10-CM | POA: Diagnosis not present

## 2015-11-18 DIAGNOSIS — S61411A Laceration without foreign body of right hand, initial encounter: Secondary | ICD-10-CM | POA: Diagnosis present

## 2015-11-18 DIAGNOSIS — W268XXA Contact with other sharp object(s), not elsewhere classified, initial encounter: Secondary | ICD-10-CM | POA: Insufficient documentation

## 2015-11-18 MED ORDER — LIDOCAINE HCL 2 % IJ SOLN
10.0000 mL | Freq: Once | INTRAMUSCULAR | Status: AC
  Administered 2015-11-18: 200 mg
  Filled 2015-11-18: qty 20

## 2015-11-18 NOTE — ED Provider Notes (Signed)
CSN: [id number]     Arrival date & time 11/18/15  2119 History  By signing my name below, I, Derek Moreno, attest that this documentation has been prepared under the direction and in the presence of No att. providers found. Electronically Signed: [name], ED Scribe. 11/18/2015. 11:01 PM.   Chief Complaint  Patient presents with  . Extremity Laceration   Patient is a 30 y.o. male presenting with skin laceration. The history is provided by the patient. No language interpreter was used.  Laceration Location:  Hand Hand laceration location:  R hand Depth:  Cutaneous Bleeding: controlled   Ineffective treatments:  None tried HPI Comments: Derek Moreno is a 30 y.o. male who presents to the Emergency Department complaining of a laceration between the right thumb and index finger onset 5 hours ago. Pt states   Past Medical History  Diagnosis Date  . Asthma    History reviewed. No pertinent past surgical history. History reviewed. No pertinent family history. Social History  Substance Use Topics  . Smoking status: Current Some Day Smoker -- 1.00 packs/day    Types: Cigarettes, Cigars  . Smokeless tobacco: Never Used  . Alcohol Use: Yes    Review of Systems  Skin: Positive for wound.  Neurological: Negative for weakness and numbness.  All other systems reviewed and are negative.     Allergies  Review of patient's allergies indicates no known allergies.  Home Medications   Prior to Admission medications   Medication Sig Start Date End Date Taking? Authorizing Provider  albuterol (PROVENTIL HFA;VENTOLIN HFA) 108 (90 Base) MCG/ACT inhaler Inhale 2 puffs into the lungs every 4 (four) hours as needed for wheezing or shortness of breath. 08/20/15   Pricilla Loveless, MD  benzonatate (TESSALON) 100 MG capsule Take 1 capsule (100 mg total) by mouth 3 (three) times daily as needed for cough. 10/29/15   Everlene Farrier, PA-C  cetirizine (ZYRTEC ALLERGY) 10 MG tablet Take 1 tablet (10  mg total) by mouth daily. 10/29/15   Everlene Farrier, PA-C  fluticasone (FLONASE) 50 MCG/ACT nasal spray Place 2 sprays into both nostrils daily. 10/29/15   Everlene Farrier, PA-C  naproxen (NAPROSYN) 250 MG tablet Take 1 tablet (250 mg total) by mouth 2 (two) times daily with a meal. 10/29/15   Everlene Farrier, PA-C  oxyCODONE-acetaminophen (PERCOCET/ROXICET) 5-325 MG tablet Take 1-2 tablets by mouth every 6 (six) hours as needed for severe pain. 11/12/15   Benjiman Core, MD   BP 122/74 mmHg  Pulse 80  Temp(Src) 98.6 F (37 C) (Oral)  Resp 20  Ht  (1.753 m)  Wt 145 lb (65.772 kg)  BMI 21.40 kg/m2  SpO2 100% Physical Exam  Constitutional: He is oriented to person, place, and time. He appears well-developed and well-nourished. No distress.  HENT:  Head: Normocephalic and atraumatic.  Mouth/Throat: Oropharynx is clear and moist. No oropharyngeal exudate.  Trachea midline  Eyes: Conjunctivae and EOM are normal. Pupils are equal, round, and reactive to light.  Neck: Trachea normal and normal range of motion. Neck supple. No JVD present. Carotid bruit is not present.  Cardiovascular: Normal rate and regular rhythm.  Exam reveals no gallop and no friction rub.   No murmur heard. Pulmonary/Chest: Effort normal and breath sounds normal. No stridor. He has no wheezes. He has no rales.  Abdominal: Soft. Bowel sounds are normal. He exhibits no mass. There is no tenderness. There is no rebound and no guarding.  Musculoskeletal: Normal range of motion.  Lymphadenopathy:  He has no cervical adenopathy.  Neurological: He is alert and oriented to person, place, and time. He has normal reflexes. No cranial nerve deficit. He exhibits normal muscle tone. Coordination normal.  Cranial nerves 2-12 intact  Skin: Skin is warm and dry. He is not diaphoretic.  Cap refill < 2 seconds; 3/4 inch laceration between the right thumb and index finger; hemostatic  Psychiatric: He has a normal mood and affect. His  behavior is normal.    ED Course  Procedures (including critical care time) DIAGNOSTIC STUDIES: Oxygen Saturation is 100% on RA, normal by my interpretation.    COORDINATION OF CARE: 11:06 PM-Discussed treatment plan which includes  bedside and pt agreed to plan.   Labs Review Labs Reviewed - No data to display  Imaging Review No results found. I have personally reviewed and evaluated these images and lab results as part of my medical decision-making.   EKG Interpretation None      MDM   Filed Vitals:   11/18/15 2127  BP: 122/74  Pulse: 80  Temp: 98.6 F (37 C)  Resp: 20    Results for orders placed or performed during the hospital encounter of 11/12/15  Urinalysis, Routine w reflex microscopic- may I&O cath if menses  Result Value Ref Range   Color, Urine YELLOW YELLOW   APPearance CLEAR CLEAR   Specific Gravity, Urine 1.040 (H) 1.005 - 1.030   pH 6.0 5.0 - 8.0   Glucose, UA NEGATIVE NEGATIVE mg/dL   Hgb urine dipstick MODERATE (A) NEGATIVE   Bilirubin Urine NEGATIVE NEGATIVE   Ketones, ur NEGATIVE NEGATIVE mg/dL   Protein, ur NEGATIVE NEGATIVE mg/dL   Nitrite NEGATIVE NEGATIVE   Leukocytes, UA NEGATIVE NEGATIVE  Urine microscopic-add on  Result Value Ref Range   Squamous Epithelial / LPF 0-5 (A) NONE SEEN   WBC, UA 0-5 0 - 5 WBC/hpf   RBC / HPF 6-30 0 - 5 RBC/hpf   Bacteria, UA RARE (A) NONE SEEN   Urine-Other MUCOUS PRESENT   Basic metabolic panel  Result Value Ref Range   Sodium 136 135 - 145 mmol/L   Potassium 3.6 3.5 - 5.1 mmol/L   Chloride 101 101 - 111 mmol/L   CO2 26 22 - 32 mmol/L   Glucose, Bld 78 65 - 99 mg/dL   BUN 12 6 - 20 mg/dL   Creatinine, Ser 4.09 0.61 - 1.24 mg/dL   Calcium 9.1 8.9 - 81.1 mg/dL   GFR calc non Af Amer >60 >60 mL/min   GFR calc Af Amer >60 >60 mL/min   Anion gap 9 5 - 15  CBC with Differential  Result Value Ref Range   WBC 8.6 4.0 - 10.5 K/uL   RBC 4.73 4.22 - 5.81 MIL/uL   Hemoglobin 13.6 13.0 - 17.0 g/dL    HCT 91.4 78.2 - 95.6 %   MCV 85.0 78.0 - 100.0 fL   MCH 28.8 26.0 - 34.0 pg   MCHC 33.8 30.0 - 36.0 g/dL   RDW 21.3 08.6 - 57.8 %   Platelets 279 150 - 400 K/uL   Neutrophils Relative % 58 %   Neutro Abs 5.0 1.7 - 7.7 K/uL   Lymphocytes Relative 32 %   Lymphs Abs 2.7 0.7 - 4.0 K/uL   Monocytes Relative 8 %   Monocytes Absolute 0.7 0.1 - 1.0 K/uL   Eosinophils Relative 2 %   Eosinophils Absolute 0.1 0.0 - 0.7 K/uL   Basophils Relative 0 %   Basophils  Absolute 0.0 0.0 - 0.1 K/uL   Ct Renal Stone Study  11/12/2015  CLINICAL DATA:  RIGHT flank pain for 3 days, dysuria, asthma, smoker, initial encounter EXAM: CT ABDOMEN AND PELVIS WITHOUT CONTRAST TECHNIQUE: Multidetector CT imaging of the abdomen and pelvis was performed following the standard protocol without IV contrast. Sagittal and coronal MPR images reconstructed from axial data set. Oral contrast not administered for this indication. COMPARISON:  None FINDINGS: Lung bases clear. Kidneys unremarkable. No urinary tract calcification, hydronephrosis or ureteral dilatation. Bladder decompressed. Within limits of noncontrast technique, no focal abnormalities of the liver, gallbladder, spleen, pancreas, kidneys or adrenal glands. Appendicolith within appendix with distal appendix prominent size at 7-8 mm diameter; however no definite periappendiceal inflammatory changes are identified. Stomach and bowel loops otherwise normal appearance for noncontrast technique. No mass, adenopathy, free air, free fluid or hernia. Osseous structures normal appearance. IMPRESSION: No evidence of urinary tract calcification or dilatation. Appendicolith within appendix with distal mild enlargement of the appendix measuring 7-8 mm diameter ; while no definite periappendiceal inflammatory changes are identified, would recommend clinical exclusion of appendicitis. Remainder of exam unremarkable. Electronically Signed   By: Ulyses SouthwardMark  Boles M.D.   On: 11/12/2015 19:10     Medications - No data to display  Final diagnoses:  None    Filed Vitals:   11/18/15 2127  BP: 122/74  Pulse: 80  Temp: 98.6 F (37 C)  Resp: 20    Results for orders placed or performed during the hospital encounter of 11/12/15  Urinalysis, Routine w reflex microscopic- may I&O cath if menses  Result Value Ref Range   Color, Urine YELLOW YELLOW   APPearance CLEAR CLEAR   Specific Gravity, Urine 1.040 (H) 1.005 - 1.030   pH 6.0 5.0 - 8.0   Glucose, UA NEGATIVE NEGATIVE mg/dL   Hgb urine dipstick MODERATE (A) NEGATIVE   Bilirubin Urine NEGATIVE NEGATIVE   Ketones, ur NEGATIVE NEGATIVE mg/dL   Protein, ur NEGATIVE NEGATIVE mg/dL   Nitrite NEGATIVE NEGATIVE   Leukocytes, UA NEGATIVE NEGATIVE  Urine microscopic-add on  Result Value Ref Range   Squamous Epithelial / LPF 0-5 (A) NONE SEEN   WBC, UA 0-5 0 - 5 WBC/hpf   RBC / HPF 6-30 0 - 5 RBC/hpf   Bacteria, UA RARE (A) NONE SEEN   Urine-Other MUCOUS PRESENT   Basic metabolic panel  Result Value Ref Range   Sodium 136 135 - 145 mmol/L   Potassium 3.6 3.5 - 5.1 mmol/L   Chloride 101 101 - 111 mmol/L   CO2 26 22 - 32 mmol/L   Glucose, Bld 78 65 - 99 mg/dL   BUN 12 6 - 20 mg/dL   Creatinine, Ser 4.691.12 0.61 - 1.24 mg/dL   Calcium 9.1 8.9 - 62.910.3 mg/dL   GFR calc non Af Amer >60 >60 mL/min   GFR calc Af Amer >60 >60 mL/min   Anion gap 9 5 - 15  CBC with Differential  Result Value Ref Range   WBC 8.6 4.0 - 10.5 K/uL   RBC 4.73 4.22 - 5.81 MIL/uL   Hemoglobin 13.6 13.0 - 17.0 g/dL   HCT 52.840.2 41.339.0 - 24.452.0 %   MCV 85.0 78.0 - 100.0 fL   MCH 28.8 26.0 - 34.0 pg   MCHC 33.8 30.0 - 36.0 g/dL   RDW 01.013.5 27.211.5 - 53.615.5 %   Platelets 279 150 - 400 K/uL   Neutrophils Relative % 58 %   Neutro Abs 5.0 1.7 -  7.7 K/uL   Lymphocytes Relative 32 %   Lymphs Abs 2.7 0.7 - 4.0 K/uL   Monocytes Relative 8 %   Monocytes Absolute 0.7 0.1 - 1.0 K/uL   Eosinophils Relative 2 %   Eosinophils Absolute 0.1 0.0 - 0.7 K/uL   Basophils  Relative 0 %   Basophils Absolute 0.0 0.0 - 0.1 K/uL   Ct Renal Stone Study  11/12/2015  CLINICAL DATA:  RIGHT flank pain for 3 days, dysuria, asthma, smoker, initial encounter EXAM: CT ABDOMEN AND PELVIS WITHOUT CONTRAST TECHNIQUE: Multidetector CT imaging of the abdomen and pelvis was performed following the standard protocol without IV contrast. Sagittal and coronal MPR images reconstructed from axial data set. Oral contrast not administered for this indication. COMPARISON:  None FINDINGS: Lung bases clear. Kidneys unremarkable. No urinary tract calcification, hydronephrosis or ureteral dilatation. Bladder decompressed. Within limits of noncontrast technique, no focal abnormalities of the liver, gallbladder, spleen, pancreas, kidneys or adrenal glands. Appendicolith within appendix with distal appendix prominent size at 7-8 mm diameter; however no definite periappendiceal inflammatory changes are identified. Stomach and bowel loops otherwise normal appearance for noncontrast technique. No mass, adenopathy, free air, free fluid or hernia. Osseous structures normal appearance. IMPRESSION: No evidence of urinary tract calcification or dilatation. Appendicolith within appendix with distal mild enlargement of the appendix measuring 7-8 mm diameter ; while no definite periappendiceal inflammatory changes are identified, would recommend clinical exclusion of appendicitis. Remainder of exam unremarkable. Electronically Signed   By: Ulyses Southward M.D.   On: 11/12/2015 19:10    Closed by Teressa Senter  I personally performed the services described in this documentation, which was scribed in my presence. The recorded information has been reviewed and is accurate.     Cy Blamer, MD 11/25/15 [phone]

## 2015-11-18 NOTE — ED Notes (Signed)
Pt in with laceration to R hand from the metal leg of a table. Dressed and bleeding controlled. Pt alert, interactive, ambulatory in NAD.

## 2015-11-18 NOTE — ED Notes (Signed)
Pt has 1 inch laceration to webbing of right hand between thumb and forefinger.  No bleeding noted, pt states it occurred approximately an hour ago.  Debris noted in wound, pt is soaking hand in normal saline to get it clean.

## 2015-11-19 MED ORDER — NAPROXEN 375 MG PO TABS: 375.0000 mg | ORAL_TABLET | Freq: Two times a day (BID) | ORAL | Status: DC

## 2015-11-19 MED ORDER — NAPROXEN 250 MG PO TABS
500.0000 mg | ORAL_TABLET | Freq: Once | ORAL | Status: AC
  Administered 2015-11-19: 500 mg via ORAL
  Filled 2015-11-19: qty 2

## 2015-11-19 MED ORDER — CEPHALEXIN 500 MG PO CAPS: 500.0000 mg | ORAL_CAPSULE | Freq: Four times a day (QID) | ORAL | Status: DC

## 2015-11-19 NOTE — ED Notes (Signed)
Pt verbalizes understanding of d/c instructions and denies any further needs at this time. 

## 2015-11-19 NOTE — ED Provider Notes (Signed)
LACERATION REPAIR Performed by: Chase PicketJaime Pilcher Ward Authorized by: Chase PicketJaime Pilcher Ward Consent: Verbal consent obtained. Risks and benefits: risks, benefits and alternatives were discussed Consent given by: patient Patient identity confirmed: provided demographic data Prepped and Draped in normal sterile fashion Wound explored Laceration Location: Right thumb/index web space Laceration Length: 1 inch No Foreign Bodies seen or palpated Anesthesia: local infiltration Local anesthetic: lidocaine 2% Anesthetic total: 5 ml Irrigation method: syringe Amount of cleaning: standard Skin closure: 4-0 Number of sutures: 8 Technique: simple interrupted Patient tolerance: Patient tolerated the procedure well with no immediate complications.  Return precautions and home care instructions were discussed with patient. Follow up care also discussed. All questions answered.   [facility] HealthcareJaime Pilcher Ward, PA-C 11/19/15 0007  Cy BlamerApril Palumbo, MD 11/19/15 [phone]

## 2015-11-29 ENCOUNTER — Emergency Department (HOSPITAL_BASED_OUTPATIENT_CLINIC_OR_DEPARTMENT_OTHER)
Admission: EM | Admit: 2015-11-29 | Discharge: 2015-11-29 | Disposition: A | Discharge status: Home / Self Care | Payer: BLUE CROSS/BLUE SHIELD | Attending: Emergency Medicine | Admitting: Emergency Medicine

## 2015-11-29 ENCOUNTER — Encounter (HOSPITAL_BASED_OUTPATIENT_CLINIC_OR_DEPARTMENT_OTHER): Payer: Self-pay | Admitting: *Deleted

## 2015-11-29 DIAGNOSIS — J45909 Unspecified asthma, uncomplicated: Secondary | ICD-10-CM | POA: Insufficient documentation

## 2015-11-29 DIAGNOSIS — R369 Urethral discharge, unspecified: Secondary | ICD-10-CM | POA: Insufficient documentation

## 2015-11-29 DIAGNOSIS — Z79899 Other long term (current) drug therapy: Secondary | ICD-10-CM | POA: Insufficient documentation

## 2015-11-29 DIAGNOSIS — Z711 Person with feared health complaint in whom no diagnosis is made: Secondary | ICD-10-CM

## 2015-11-29 DIAGNOSIS — F17210 Nicotine dependence, cigarettes, uncomplicated: Secondary | ICD-10-CM | POA: Diagnosis not present

## 2015-11-29 LAB — URINE MICROSCOPIC-ADD ON

## 2015-11-29 LAB — URINALYSIS, ROUTINE W REFLEX MICROSCOPIC
BILIRUBIN URINE: NEGATIVE
Glucose, UA: NEGATIVE mg/dL
KETONES UR: 15 mg/dL — AB
NITRITE: NEGATIVE
PH: 6 (ref 5.0–8.0)
Protein, ur: NEGATIVE mg/dL
Specific Gravity, Urine: 1.026 (ref 1.005–1.030)

## 2015-11-29 MED ORDER — CEFTRIAXONE SODIUM 250 MG IJ SOLR
250.0000 mg | Freq: Once | INTRAMUSCULAR | Status: AC
  Administered 2015-11-29: 250 mg via INTRAMUSCULAR
  Filled 2015-11-29: qty 250

## 2015-11-29 MED ORDER — AZITHROMYCIN 250 MG PO TABS
1000.0000 mg | ORAL_TABLET | Freq: Once | ORAL | Status: AC
  Administered 2015-11-29: 1000 mg via ORAL
  Filled 2015-11-29: qty 4

## 2015-11-29 MED ORDER — BACITRACIN ZINC 500 UNIT/GM EX OINT: 1.0000 "application " | TOPICAL_OINTMENT | Freq: Two times a day (BID) | CUTANEOUS | Status: DC

## 2015-11-29 MED ORDER — LIDOCAINE HCL (PF) 1 % IJ SOLN
INTRAMUSCULAR | Status: AC
  Administered 2015-11-29: 0.9 mL
  Filled 2015-11-29: qty 5

## 2015-11-29 NOTE — ED Provider Notes (Signed)
CSN: [id number]     Arrival date & time 11/29/15  3244 History   First MD Initiated Contact with Patient 11/29/15 1002     Chief Complaint  Patient presents with  . Penile Discharge  . Wound Check    Derek Moreno is a 30 y.o. male who presents to the emergency department complaining of penile discharge for the past 3 days. He reports she's had unprotected sex and 3 days ago began noticing penile discharge. He reports pain at the tip of his penis when he urinates. He denies any rashes or lesions to his genitals. He reports a history of gonorrhea and chlamydia. Patient also would like his right hand laceration recheck. He had a laceration repair done approximately 10 days ago and 3-4 days ago he removed the stitches himself. He reports the wound has opened up some after he took out the sutures. He denies abdominal pain, nausea, vomiting, diarrhea, numbness, tingling, weakness, fevers, testicle pain, GU rashes, mouth sores, sore throat, or rashes.   Patient is a 30 y.o. male presenting with penile discharge and wound check. The history is provided by the patient. No language interpreter was used.  Penile Discharge Pertinent negatives include no abdominal pain, chest pain, chills, congestion, coughing, fever, headaches, nausea, neck pain, numbness, rash, sore throat, vomiting or weakness.  Wound Check Pertinent negatives include no abdominal pain, chest pain, chills, congestion, coughing, fever, headaches, nausea, neck pain, numbness, rash, sore throat, vomiting or weakness.    Past Medical History  Diagnosis Date  . Asthma    History reviewed. No pertinent past surgical history. No family history on file. Social History  Substance Use Topics  . Smoking status: Current Some Day Smoker -- 1.00 packs/day    Types: Cigarettes, Cigars  . Smokeless tobacco: Never Used  . Alcohol Use: Yes     Comment: occassional    Review of Systems  Constitutional: Negative for fever and chills.  HENT:  Negative for congestion and sore throat.   Eyes: Negative for visual disturbance.  Respiratory: Negative for cough, shortness of breath and wheezing.   Cardiovascular: Negative for chest pain and palpitations.  Gastrointestinal: Negative for nausea, vomiting, abdominal pain and diarrhea.  Genitourinary: Positive for dysuria and discharge. Negative for urgency, frequency, hematuria, flank pain, decreased urine volume, penile swelling, scrotal swelling, difficulty urinating, genital sores, penile pain and testicular pain.  Musculoskeletal: Negative for back pain and neck pain.  Skin: Positive for wound. Negative for rash.  Neurological: Negative for weakness, numbness and headaches.      Allergies  Review of patient's allergies indicates no known allergies.  Home Medications   Prior to Admission medications   Medication Sig Start Date End Date Taking? Authorizing Provider  albuterol (PROVENTIL HFA;VENTOLIN HFA) 108 (90 Base) MCG/ACT inhaler Inhale 2 puffs into the lungs every 4 (four) hours as needed for wheezing or shortness of breath. 08/20/15  Yes Pricilla Loveless, MD  naproxen (NAPROSYN) 250 MG tablet Take 1 tablet (250 mg total) by mouth 2 (two) times daily with a meal. 10/29/15  Yes Everlene Farrier, PA-C  naproxen (NAPROSYN) 375 MG tablet Take 1 tablet (375 mg total) by mouth 2 (two) times daily. 11/19/15  Yes April Palumbo, MD  bacitracin ointment Apply 1 application topically 2 (two) times daily. 11/29/15   Everlene Farrier, PA-C  benzonatate (TESSALON) 100 MG capsule Take 1 capsule (100 mg total) by mouth 3 (three) times daily as needed for cough. 10/29/15   Everlene Farrier, PA-C  cephALEXin [facility])  500 MG capsule Take 1 capsule (500 mg total) by mouth 4 (four) times daily. 11/19/15   April Palumbo, MD  cetirizine (ZYRTEC ALLERGY) 10 MG tablet Take 1 tablet (10 mg total) by mouth daily. 10/29/15   Everlene Farrier, PA-C  fluticasone (FLONASE) 50 MCG/ACT nasal spray Place 2 sprays into both nostrils  daily. 10/29/15   Everlene Farrier, PA-C  oxyCODONE-acetaminophen (PERCOCET/ROXICET) 5-325 MG tablet Take 1-2 tablets by mouth every 6 (six) hours as needed for severe pain. 11/12/15   Benjiman Core, MD   BP 137/85 mmHg  Pulse 66  Temp(Src) 98.7 F (37.1 C) (Oral)  Resp 18  Ht 5\' 9"  (1.753 m)  Wt 65.772 kg  BMI 21.40 kg/m2  SpO2 100% Physical Exam  Constitutional: He appears well-developed and well-nourished. No distress.  Nontoxic appearing.  HENT:  Head: Normocephalic and atraumatic.  Mouth/Throat: Oropharynx is clear and moist.  Eyes: Conjunctivae are normal. Pupils are equal, round, and reactive to light. Right eye exhibits no discharge. Left eye exhibits no discharge.  Neck: Neck supple.  Cardiovascular: Normal rate, regular rhythm, normal heart sounds and intact distal pulses.  Exam reveals no gallop and no friction rub.   No murmur heard. Pulmonary/Chest: Effort normal and breath sounds normal. No respiratory distress. He has no wheezes. He has no rales.  Abdominal: Soft. There is no tenderness. There is no guarding.  Abdomen is soft and nontender to palpation.  Genitourinary: No penile tenderness.  GU exam performed by me with male RN chaperone. No rashes or lesions noted. No penile discharge noted. Meatus is open. No testicular tenderness to palpation. Scrotal contents is normal.  Musculoskeletal: He exhibits no edema.  Healing laceration between his right thumb and index finger that's approximately 2 cm. Bleeding is controlled. No discharge. No sign of infection. He has good range of motion of his right fingers and thumb. No weakness appreciated.  Lymphadenopathy:    He has no cervical adenopathy.  Neurological: He is alert. Coordination normal.  Sensation is intact to his right distal fingertips.  Skin: Skin is warm and dry. No rash noted. He is not diaphoretic. No erythema. No pallor.  Psychiatric: He has a normal mood and affect. His behavior is normal.  Nursing note  and vitals reviewed.   ED Course  Procedures (including critical care time) Labs Review Labs Reviewed  URINALYSIS, ROUTINE W REFLEX MICROSCOPIC (NOT AT [facility]) - Abnormal; Notable for the following:    APPearance TURBID (*)    Hgb urine dipstick MODERATE (*)    Ketones, ur 15 (*)    Leukocytes, UA MODERATE (*)    All other components within normal limits  URINE MICROSCOPIC-ADD ON - Abnormal; Notable for the following:    Squamous Epithelial / LPF 0-5 (*)    Bacteria, UA MANY (*)    Crystals CA OXALATE CRYSTALS (*)    All other components within normal limits  URINE CULTURE  RPR  HIV ANTIBODY (ROUTINE TESTING)  GC/CHLAMYDIA PROBE AMP ([place]) NOT AT [facility]    Imaging Review No results found. I have personally reviewed and evaluated these lab results as part of my medical decision-making.   EKG Interpretation None      Filed Vitals:   11/29/15 0943  BP: 137/85  Pulse: 66  Temp: 98.7 F (37.1 C)  TempSrc: Oral  Resp: 18  Height: 5\' 9"  (1.753 m)  Weight: 65.772 kg  SpO2: 100%     MDM   Meds given in ED:  Medications  cefTRIAXone (ROCEPHIN) injection 250 mg (not administered)  azithromycin (ZITHROMAX) tablet 1,000 mg (not administered)    New Prescriptions   BACITRACIN OINTMENT    Apply 1 application topically 2 (two) times daily.    Final diagnoses:  Concern about STD in male without diagnosis  Penile discharge   This is a 30 y.o. male who presents to the emergency department complaining of penile discharge for the past 3 days. He reports she's had unprotected sex and 3 days ago began noticing penile discharge. He reports pain at the tip of his penis when he urinates. He denies any rashes or lesions to his genitals. He reports a history of gonorrhea and chlamydia. Patient also would like his right hand laceration recheck. He had a laceration repair done approximately 10 days ago and 3-4 days ago he removed the stitches himself. He reports the wound has opened  up some after he took out the sutures. On exam the patient is afebrile and nontoxic appearing. He denies any abdominal pain. His abdomen is soft and nontender to palpation. GU exam no penile discharge noted. His scrotal contents is normal. No lesions noted. Penis off for gonorrhea and chlamydia. Patient has a healing laceration between his right thumb and index finger. Bleeding is controlled. It appears to be healing well. It appears sutures were removed to early. There is some gaping of the wound with certain movements of his hand. Will bandage and I discussed wound care with the patient.  Urinalysis obtained in triage shows moderate leukocytes and is nitrite negative. With patient's history of unprotected sex and penile discharge feel is appropriate to go ahead and treat him for STDs with a azithromycin and Rocephin. Will send urine for culture. I do not feel the need to treat him for urinary tract infection at this time. Patient also had gonorrhea, chlamydia, syphilis and HIV testing pending. I discussed that these tests were pending. I had an extensive education on safe sex practices with this patient. I discussed return precautions. I [facility]. I advised she needs to inform all partners. I advised no sexual intercourse until he has test results. I advised the patient to follow-up with their primary care provider this week. I advised the patient to return to the emergency department with new or worsening symptoms or new concerns. The patient verbalized understanding and agreement with plan.      Everlene FarrierWilliam Christin Mccreedy, PA-C 11/29/15 1111  Arby BarretteMarcy Pfeiffer, MD 12/13/15 1949

## 2015-11-29 NOTE — ED Notes (Signed)
Patient states he has had painful urination and white penile discharge for the last three days.  States he has had unprotected sex.  States he would also like to have his right hand rechecked from sutures that were placed two weeks ago.

## 2015-11-29 NOTE — Discharge Instructions (Signed)
Sexually Transmitted Disease °A sexually transmitted disease (STD) is a disease or infection that may be passed (transmitted) from person to person, usually during sexual activity. This may happen by way of saliva, semen, blood, vaginal mucus, or urine. Common STDs include: °· Gonorrhea. °· Chlamydia. °· Syphilis. °· HIV and AIDS. °· Genital herpes. °· Hepatitis B and C. °· Trichomonas. °· Human papillomavirus (HPV). °· Pubic lice. °· Scabies. °· Mites. °· Bacterial vaginosis. °WHAT ARE CAUSES OF STDs? °An STD may be caused by bacteria, a virus, or parasites. STDs are often transmitted during sexual activity if one person is infected. However, they may also be transmitted through nonsexual means. STDs may be transmitted after:  °· Sexual intercourse with an infected person. °· Sharing sex toys with an infected person. °· Sharing needles with an infected person or using unclean piercing or tattoo needles. °· Having intimate contact with the genitals, mouth, or rectal areas of an infected person. °· Exposure to infected fluids during birth. °WHAT ARE THE SIGNS AND SYMPTOMS OF STDs? °Different STDs have different symptoms. Some people may not have any symptoms. If symptoms are present, they may include: °· Painful or bloody urination. °· Pain in the pelvis, abdomen, vagina, anus, throat, or eyes. °· A skin rash, itching, or irritation. °· Growths, ulcerations, blisters, or sores in the genital and anal areas. °· Abnormal vaginal discharge with or without bad odor. °· Penile discharge in men. °· Fever. °· Pain or bleeding during sexual intercourse. °· Swollen glands in the groin area. °· Yellow skin and eyes (jaundice). This is seen with hepatitis. °· Swollen testicles. °· Infertility. °· Sores and blisters in the mouth. °HOW ARE STDs DIAGNOSED? °To make a diagnosis, your health care provider may: °· Take a medical history. °· Perform a physical exam. °· Take a sample of any discharge to examine. °· Swab the throat,  cervix, opening to the penis, rectum, or vagina for testing. °· Test a sample of your first morning urine. °· Perform blood tests. °· Perform a Pap test, if this applies. °· Perform a colposcopy. °· Perform a laparoscopy. °HOW ARE STDs TREATED? °Treatment depends on the STD. Some STDs may be treated but not cured. °· Chlamydia, gonorrhea, trichomonas, and syphilis can be cured with antibiotic medicine. °· Genital herpes, hepatitis, and HIV can be treated, but not cured, with prescribed medicines. The medicines lessen symptoms. °· Genital warts from HPV can be treated with medicine or by freezing, burning (electrocautery), or surgery. Warts may come back. °· HPV cannot be cured with medicine or surgery. However, abnormal areas may be removed from the cervix, vagina, or vulva. °· If your diagnosis is confirmed, your recent sexual partners need treatment. This is true even if they are symptom-free or have a negative culture or evaluation. They should not have sex until their health care providers say it is okay. °· Your health care provider may test you for infection again 3 months after treatment. °HOW CAN I REDUCE MY RISK OF GETTING AN STD? °Take these steps to reduce your risk of getting an STD: °· Use latex condoms, dental dams, and water-soluble lubricants during sexual activity. Do not use petroleum jelly or oils. °· Avoid having multiple sex partners. °· Do not have sex with someone who has other sex partners °· Do not have sex with anyone you do not know or who is at high risk for an STD. °· Avoid risky sex practices that can break your skin. °· Do not have sex   if you have open sores on your mouth or skin.  Avoid drinking too much alcohol or taking illegal drugs. Alcohol and drugs can affect your judgment and put you in a vulnerable position.  Avoid engaging in oral and anal sex acts.  Get vaccinated for HPV and hepatitis. If you have not received these vaccines in the past, talk to your health care  provider about whether one or both might be right for you.  If you are at risk of being infected with HIV, it is recommended that you take a prescription medicine daily to prevent HIV infection. This is called pre-exposure prophylaxis (PrEP). You are considered at risk if:  You are a man who has sex with other men (MSM).  You are a heterosexual man or woman and are sexually active with more than one partner.  You take drugs by injection.  You are sexually active with a partner who has HIV.  Talk with your health care provider about whether you are at high risk of being infected with HIV. If you choose to begin PrEP, you should first be tested for HIV. You should then be tested every 3 months for as long as you are taking PrEP. WHAT SHOULD I DO IF I THINK I HAVE AN STD?  See your health care provider.  Tell your sexual partner(s). They should be tested and treated for any STDs.  Do not have sex until your health care provider says it is okay. WHEN SHOULD I GET IMMEDIATE MEDICAL CARE? Contact your health care provider right away if:   You have severe abdominal pain.  You are a man and notice swelling or pain in your testicles.  You are a woman and notice swelling or pain in your vagina.   This information is not intended to replace advice given to you by your health care provider. Make sure you discuss any questions you have with your health care provider.   Document Released: 09/04/2002 Document Revised: 07/05/2014 Document Reviewed: 01/02/2013 Elsevier Interactive Patient Education 2016 [employer] Laceration Care, Adult A laceration is a cut that goes through all of the layers of the skin and into the tissue that is right under the skin. Some lacerations heal on their own. Others need to be closed with stitches (sutures), staples, skin adhesive strips, or skin glue. Proper laceration care minimizes the risk of infection and helps the laceration to heal better. HOW TO CARE FOR  YOUR LACERATION If sutures or staples were used:  Keep the wound clean and dry.  If you were given a bandage (dressing), you should change it at least one time per day or as told by your health care provider. You should also change it if it becomes wet or dirty.  Keep the wound completely dry for the first 24 hours or as told by your health care provider. After that time, you may shower or bathe. However, make sure that the wound is not soaked in water until after the sutures or staples have been removed.  Clean the wound one time each day or as told by your health care provider:  Wash the wound with soap and water.  Rinse the wound with water to remove all soap.  Pat the wound dry with a clean towel. Do not rub the wound.  After cleaning the wound, apply a thin layer of antibiotic ointmentas told by your health care provider. This will help to prevent infection and keep the dressing from sticking to the wound.  Have the sutures or staples removed as told by your health care provider. If skin adhesive strips were used:  Keep the wound clean and dry.  If you were given a bandage (dressing), you should change it at least one time per day or as told by your health care provider. You should also change it if it becomes dirty or wet.  Do not get the skin adhesive strips wet. You may shower or bathe, but be careful to keep the wound dry.  If the wound gets wet, pat it dry with a clean towel. Do not rub the wound.  Skin adhesive strips fall off on their own. You may trim the strips as the wound heals. Do not remove skin adhesive strips that are still stuck to the wound. They will fall off in time. If skin glue was used:  Try to keep the wound dry, but you may briefly wet it in the shower or bath. Do not soak the wound in water, such as by swimming.  After you have showered or bathed, gently pat the wound dry with a clean towel. Do not rub the wound.  Do not do any activities that will  make you sweat heavily until the skin glue has fallen off on its own.  Do not apply liquid, cream, or ointment medicine to the wound while the skin glue is in place. Using those may loosen the film before the wound has healed.  If you were given a bandage (dressing), you should change it at least one time per day or as told by your health care provider. You should also change it if it becomes dirty or wet.  If a dressing is placed over the wound, be careful not to apply tape directly over the skin glue. Doing that may cause the glue to be pulled off before the wound has healed.  Do not pick at the glue. The skin glue usually remains in place for 5-10 days, then it falls off of the skin. General Instructions  Take over-the-counter and prescription medicines only as told by your health care provider.  If you were prescribed an antibiotic medicine or ointment, take or apply it as told by your doctor. Do not stop using it even if your condition improves.  To help prevent scarring, make sure to cover your wound with sunscreen whenever you are outside after stitches are removed, after adhesive strips are removed, or when glue remains in place and the wound is healed. Make sure to wear a sunscreen of at least 30 SPF.  Do not scratch or pick at the wound.  Keep all follow-up visits as told by your health care provider. This is important.  Check your wound every day for signs of infection. Watch for:  Redness, swelling, or pain.  Fluid, blood, or pus.  Raise (elevate) the injured area above the level of your heart while you are sitting or lying down, if possible. SEEK MEDICAL CARE IF:  You received a tetanus shot and you have swelling, severe pain, redness, or bleeding at the injection site.  You have a fever.  A wound that was closed breaks open.  You notice a bad smell coming from your wound or your dressing.  You notice something coming out of the wound, such as wood or glass.  Your  pain is not controlled with medicine.  You have increased redness, swelling, or pain at the site of your wound.  You have fluid, blood, or pus coming from your  wound.  You notice a change in the color of your skin near your wound.  You need to change the dressing frequently due to fluid, blood, or pus draining from the wound.  You develop a new rash.  You develop numbness around the wound. SEEK IMMEDIATE MEDICAL CARE IF:  You develop severe swelling around the wound.  Your pain suddenly increases and is severe.  You develop painful lumps near the wound or on skin that is anywhere on your body.  You have a red streak going away from your wound.  The wound is on your hand or foot and you cannot properly move a finger or toe.  The wound is on your hand or foot and you notice that your fingers or toes look pale or bluish.   This information is not intended to replace advice given to you by your health care provider. Make sure you discuss any questions you have with your health care provider.   Document Released: 06/14/2005 Document Revised: 10/29/2014 Document Reviewed: 06/10/2014 Elsevier Interactive Patient Education [employer]

## 2015-11-30 LAB — URINE CULTURE: Culture: 5000 — AB

## 2015-11-30 LAB — HIV ANTIBODY (ROUTINE TESTING W REFLEX): HIV Screen 4th Generation wRfx: NONREACTIVE

## 2015-11-30 LAB — RPR: RPR: NONREACTIVE

## 2015-12-01 LAB — GC/CHLAMYDIA PROBE AMP (~~LOC~~) NOT AT ARMC
CHLAMYDIA, DNA PROBE: NEGATIVE
NEISSERIA GONORRHEA: NEGATIVE

## 2016-02-05 ENCOUNTER — Emergency Department (HOSPITAL_BASED_OUTPATIENT_CLINIC_OR_DEPARTMENT_OTHER)
Admission: EM | Admit: 2016-02-05 | Discharge: 2016-02-05 | Disposition: A | Discharge status: Home / Self Care | Payer: BLUE CROSS/BLUE SHIELD | Attending: Emergency Medicine | Admitting: Emergency Medicine

## 2016-02-05 ENCOUNTER — Encounter (HOSPITAL_BASED_OUTPATIENT_CLINIC_OR_DEPARTMENT_OTHER): Payer: Self-pay

## 2016-02-05 DIAGNOSIS — J45909 Unspecified asthma, uncomplicated: Secondary | ICD-10-CM | POA: Insufficient documentation

## 2016-02-05 DIAGNOSIS — Z79899 Other long term (current) drug therapy: Secondary | ICD-10-CM | POA: Diagnosis not present

## 2016-02-05 DIAGNOSIS — F17210 Nicotine dependence, cigarettes, uncomplicated: Secondary | ICD-10-CM | POA: Insufficient documentation

## 2016-02-05 DIAGNOSIS — R300 Dysuria: Secondary | ICD-10-CM | POA: Diagnosis present

## 2016-02-05 LAB — URINALYSIS, ROUTINE W REFLEX MICROSCOPIC
Bilirubin Urine: NEGATIVE
Glucose, UA: NEGATIVE mg/dL
KETONES UR: 15 mg/dL — AB
LEUKOCYTES UA: NEGATIVE
NITRITE: NEGATIVE
PH: 6 (ref 5.0–8.0)
Protein, ur: 30 mg/dL — AB
SPECIFIC GRAVITY, URINE: 1.041 — AB (ref 1.005–1.030)

## 2016-02-05 LAB — URINE MICROSCOPIC-ADD ON

## 2016-02-05 MED ORDER — AZITHROMYCIN 250 MG PO TABS
1000.0000 mg | ORAL_TABLET | Freq: Once | ORAL | Status: AC
  Administered 2016-02-05: 1000 mg via ORAL
  Filled 2016-02-05: qty 4

## 2016-02-05 MED ORDER — CEFTRIAXONE SODIUM 250 MG IJ SOLR
250.0000 mg | Freq: Once | INTRAMUSCULAR | Status: AC
  Administered 2016-02-05: 250 mg via INTRAMUSCULAR
  Filled 2016-02-05: qty 250

## 2016-02-05 NOTE — ED Triage Notes (Signed)
Pt c/o burning with urination as well as a sore throat for the last several days.  Pt has not tried any medications at home for the throat pain, is unsure if he has STD concerns.

## 2016-02-05 NOTE — ED Provider Notes (Addendum)
[facility]   CSN: [id number] Arrival date & time: 02/05/16  0141  First Provider Contact:  None       History   Chief Complaint Chief Complaint  Patient presents with  . Dysuria    HPI Derek Moreno is a 30 y.o. male with a several day history of an abscess normal sensation when urinating. He denies frank pain. He does seem to feel some tingling when he urinates. Symptoms are mild. He has had no urethral discharge. He has had a scratchy throat for the past several days. He denies fever, chills, abdominal pain, nausea, vomiting or diarrhea.  HPI  Past Medical History:  Diagnosis Date  . Asthma     Patient Active Problem List   Diagnosis Date Noted  . Right flank pain 11/12/2015  . Dysuria 11/12/2015  . Pain in right thigh 11/12/2015  . Appendicolith 11/12/2015  . Tobacco abuse 11/12/2015    History reviewed. No pertinent surgical history.     Home Medications    Prior to Admission medications   Medication Sig Start Date End Date Taking? Authorizing Provider  albuterol (PROVENTIL HFA;VENTOLIN HFA) 108 (90 Base) MCG/ACT inhaler Inhale 2 puffs into the lungs every 4 (four) hours as needed for wheezing or shortness of breath. 08/20/15  Yes Pricilla Loveless, MD  bacitracin ointment Apply 1 application topically 2 (two) times daily. 11/29/15   Everlene Farrier, PA-C  benzonatate (TESSALON) 100 MG capsule Take 1 capsule (100 mg total) by mouth 3 (three) times daily as needed for cough. 10/29/15   Everlene Farrier, PA-C  cephALEXin (KEFLEX) 500 MG capsule Take 1 capsule (500 mg total) by mouth 4 (four) times daily. 11/19/15   April Palumbo, MD  cetirizine (ZYRTEC ALLERGY) 10 MG tablet Take 1 tablet (10 mg total) by mouth daily. 10/29/15   Everlene Farrier, PA-C  fluticasone (FLONASE) 50 MCG/ACT nasal spray Place 2 sprays into both nostrils daily. 10/29/15   Everlene Farrier, PA-C  naproxen (NAPROSYN) 250 MG tablet Take 1 tablet (250 mg total) by mouth 2 (two) times  daily with a meal. 10/29/15   Everlene Farrier, PA-C  naproxen (NAPROSYN) 375 MG tablet Take 1 tablet (375 mg total) by mouth 2 (two) times daily. 11/19/15   April Palumbo, MD  oxyCODONE-acetaminophen (PERCOCET/ROXICET) 5-325 MG tablet Take 1-2 tablets by mouth every 6 (six) hours as needed for severe pain. 11/12/15   Benjiman Core, MD    Family History No family history on file.  Social History Social History  Substance Use Topics  . Smoking status: Current Some Day Smoker    Packs/day: 1.00    Types: Cigarettes, Cigars  . Smokeless tobacco: Never Used  . Alcohol use Yes     Comment: occassional     Allergies   Review of patient's allergies indicates no known allergies.   Review of Systems Review of Systems  All other systems reviewed and are negative.    Physical Exam Updated Vital Signs BP 134/89 (BP Location: Left Arm)   Pulse 74   Temp 97.9 F (36.6 C) (Oral)   Resp 18   Ht  (1.753 m)   Wt 135 lb (61.2 kg)   SpO2 100%   BMI 19.94 kg/m   Physical Exam General: Well-developed, well-nourished male in no acute distress; appearance consistent with age of record HENT: normocephalic; atraumatic; no pharyngeal erythema or exudate; no dysphonia Eyes: pupils equal, round and reactive to light; extraocular muscles intact Neck: supple Heart: regular rate and  rhythm Lungs: clear to auscultation bilaterally Abdomen: soft; nondistended; nontender; no masses or hepatosplenomegaly; bowel sounds present GU: Tanner 5 male, circumcised; no urethral discharge Extremities: No deformity; full range of motion; pulses normal Neurologic: Awake, alert and oriented; motor function intact in all extremities and symmetric; no facial droop Skin: Warm and dry Psychiatric: Normal mood and affect    ED Treatments / Results   Nursing notes and vitals signs, including pulse oximetry, reviewed.  Summary of this visit's results, reviewed by myself:  Labs:  Results for orders placed  or performed during the hospital encounter of 02/05/16 (from the past 24 hour(s))  Urinalysis, Routine w reflex microscopic ([facility])     Status: Abnormal   Collection Time: 02/05/16  1:50 AM  Result Value Ref Range   Color, Urine AMBER (A) YELLOW   APPearance CLEAR CLEAR   Specific Gravity, Urine 1.041 (H) 1.005 - 1.030   pH 6.0 5.0 - 8.0   Glucose, UA NEGATIVE NEGATIVE mg/dL   Hgb urine dipstick TRACE (A) NEGATIVE   Bilirubin Urine NEGATIVE NEGATIVE   Ketones, ur 15 (A) NEGATIVE mg/dL   Protein, ur 30 (A) NEGATIVE mg/dL   Nitrite NEGATIVE NEGATIVE   Leukocytes, UA NEGATIVE NEGATIVE  Urine microscopic-add on     Status: Abnormal   Collection Time: 02/05/16  1:50 AM  Result Value Ref Range   Squamous Epithelial / LPF 0-5 (A) NONE SEEN   WBC, UA 0-5 0 - 5 WBC/hpf   RBC / HPF 6-30 0 - 5 RBC/hpf   Bacteria, UA RARE (A) NONE SEEN   Urine-Other MUCOUS PRESENT      Procedures (including critical care time)   Final Clinical Impressions(s) / ED Diagnoses  Will treat for possible urethritis. GC/Chlamydia and urine culture pending.  Final diagnoses:  Dysuria      Paula LibraJohn Anelise Staron, MD 02/05/16 [id number]    Paula LibraJohn Quinta Eimer, MD 02/05/16 [id number]

## 2016-02-05 NOTE — ED Notes (Signed)
Pt verbalizes understanding of d/c instructions and denies any further needs at this time. 

## 2016-02-06 LAB — URINE CULTURE

## 2016-02-06 LAB — RPR: RPR Ser Ql: NONREACTIVE

## 2016-02-06 LAB — HIV ANTIBODY (ROUTINE TESTING W REFLEX): HIV Screen 4th Generation wRfx: NONREACTIVE

## 2016-02-06 LAB — GC/CHLAMYDIA PROBE AMP (~~LOC~~) NOT AT ARMC
Chlamydia: NEGATIVE
Neisseria Gonorrhea: NEGATIVE

## 2016-10-25 ENCOUNTER — Encounter (HOSPITAL_BASED_OUTPATIENT_CLINIC_OR_DEPARTMENT_OTHER): Payer: Self-pay

## 2016-10-25 ENCOUNTER — Emergency Department (HOSPITAL_BASED_OUTPATIENT_CLINIC_OR_DEPARTMENT_OTHER)
Admission: EM | Admit: 2016-10-25 | Discharge: 2016-10-25 | Disposition: A | Discharge status: Home / Self Care | Payer: BLUE CROSS/BLUE SHIELD | Attending: Dermatology | Admitting: Dermatology

## 2016-10-25 DIAGNOSIS — M545 Low back pain: Secondary | ICD-10-CM | POA: Diagnosis not present

## 2016-10-25 DIAGNOSIS — J45909 Unspecified asthma, uncomplicated: Secondary | ICD-10-CM | POA: Diagnosis not present

## 2016-10-25 DIAGNOSIS — Z5321 Procedure and treatment not carried out due to patient leaving prior to being seen by health care provider: Secondary | ICD-10-CM | POA: Diagnosis not present

## 2016-10-25 DIAGNOSIS — Z87891 Personal history of nicotine dependence: Secondary | ICD-10-CM | POA: Insufficient documentation

## 2016-10-25 DIAGNOSIS — Y939 Activity, unspecified: Secondary | ICD-10-CM | POA: Diagnosis not present

## 2016-10-25 DIAGNOSIS — Y999 Unspecified external cause status: Secondary | ICD-10-CM | POA: Diagnosis not present

## 2016-10-25 DIAGNOSIS — Y92410 Unspecified street and highway as the place of occurrence of the external cause: Secondary | ICD-10-CM | POA: Diagnosis not present

## 2016-10-25 DIAGNOSIS — S3992XA Unspecified injury of lower back, initial encounter: Secondary | ICD-10-CM | POA: Diagnosis present

## 2016-10-25 NOTE — ED Notes (Signed)
Pt states he is leaving due to wait. Pt made aware that PA should be there to see him in a few minutes. Pt decided to leave.
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
## 2016-10-25 NOTE — ED Provider Notes (Signed)
Patient LWBS.  I did not see or evaluate the patient.   Roxy Horseman, PA-C 10/25/16 2246    Melene Plan, DO 10/25/16 2354

## 2017-03-07 ENCOUNTER — Emergency Department (HOSPITAL_BASED_OUTPATIENT_CLINIC_OR_DEPARTMENT_OTHER)
Admission: EM | Admit: 2017-03-07 | Discharge: 2017-03-07 | Disposition: A | Discharge status: Home / Self Care | Payer: BLUE CROSS/BLUE SHIELD | Attending: Emergency Medicine | Admitting: Emergency Medicine

## 2017-03-07 ENCOUNTER — Encounter (HOSPITAL_BASED_OUTPATIENT_CLINIC_OR_DEPARTMENT_OTHER): Payer: Self-pay | Admitting: *Deleted

## 2017-03-07 ENCOUNTER — Emergency Department (HOSPITAL_BASED_OUTPATIENT_CLINIC_OR_DEPARTMENT_OTHER): Payer: BLUE CROSS/BLUE SHIELD

## 2017-03-07 DIAGNOSIS — J4531 Mild persistent asthma with (acute) exacerbation: Secondary | ICD-10-CM | POA: Diagnosis not present

## 2017-03-07 DIAGNOSIS — Z87891 Personal history of nicotine dependence: Secondary | ICD-10-CM | POA: Diagnosis not present

## 2017-03-07 DIAGNOSIS — J069 Acute upper respiratory infection, unspecified: Secondary | ICD-10-CM | POA: Insufficient documentation

## 2017-03-07 DIAGNOSIS — J45901 Unspecified asthma with (acute) exacerbation: Secondary | ICD-10-CM

## 2017-03-07 DIAGNOSIS — R0602 Shortness of breath: Secondary | ICD-10-CM | POA: Diagnosis present

## 2017-03-07 MED ORDER — ALBUTEROL SULFATE (2.5 MG/3ML) 0.083% IN NEBU
5.0000 mg | INHALATION_SOLUTION | Freq: Once | RESPIRATORY_TRACT | Status: AC
  Administered 2017-03-07: 5 mg via RESPIRATORY_TRACT
  Filled 2017-03-07: qty 6

## 2017-03-07 MED ORDER — PREDNISONE 20 MG PO TABS: 40.0000 mg | ORAL_TABLET | Freq: Every day | ORAL | 0 refills | Status: DC

## 2017-03-07 MED ORDER — PREDNISONE 50 MG PO TABS
60.0000 mg | ORAL_TABLET | Freq: Once | ORAL | Status: AC
  Administered 2017-03-07: 60 mg via ORAL
  Filled 2017-03-07: qty 1

## 2017-03-07 MED ORDER — IPRATROPIUM BROMIDE 0.02 % IN SOLN
0.5000 mg | Freq: Once | RESPIRATORY_TRACT | Status: AC
Start: 2017-03-07 — End: 2017-03-07
  Administered 2017-03-07: 0.5 mg via RESPIRATORY_TRACT
  Filled 2017-03-07: qty 2.5

## 2017-03-07 MED FILL — predniSONE 20 MG TABS: 20 | 5 days supply | Qty: 10 | Fill #0

## 2017-03-07 NOTE — ED Provider Notes (Signed)
[facility]   CSN: [id number] Arrival date & time: 03/07/17  1011     History   Chief Complaint Chief Complaint  Patient presents with  . Asthma    HPI Derek Moreno is a 31 y.o. male.  The history is provided by the patient.  Asthma  This is a recurrent problem. Episode onset: 3 days ago. The problem occurs constantly. The problem has been gradually worsening. Associated symptoms include shortness of breath. Associated symptoms comments: Productive cough with green sputum, nasal congestion, wheezing using inhaler but not helping. Denies fever, abdominal pain or vomiting.. The symptoms are aggravated by walking. Nothing relieves the symptoms. Treatments tried: Inhaler. The treatment provided no relief.    Past Medical History:  Diagnosis Date  . Asthma     Patient Active Problem List   Diagnosis Date Noted  . Right flank pain 11/12/2015  . Dysuria 11/12/2015  . Pain in right thigh 11/12/2015  . Appendicolith 11/12/2015  . Tobacco abuse 11/12/2015    History reviewed. No pertinent surgical history.     Home Medications    Prior to Admission medications   Medication Sig Start Date End Date Taking? Authorizing Provider  albuterol (PROVENTIL HFA;VENTOLIN HFA) 108 (90 Base) MCG/ACT inhaler Inhale into the lungs every 6 (six) hours as needed for wheezing or shortness of breath.   Yes [provider]    Family History No family history on file.  Social History Social History  Substance Use Topics  . Smoking status: Former Smoker    Packs/day: 1.00  . Smokeless tobacco: Never Used  . Alcohol use No     Allergies   Patient has no known allergies.   Review of Systems Review of Systems  Respiratory: Positive for shortness of breath.   All other systems reviewed and are negative.    Physical Exam Updated Vital Signs BP (!) 151/73 (BP Location: Left Arm)   Pulse 70   Temp 99.6 F (37.6 C) (Oral)   Resp 16   Ht 5'  9" (1.753 m)   Wt 63.5 kg (140 lb)   SpO2 100%   BMI 20.67 kg/m   Physical Exam  Constitutional: He is oriented to person, place, and time. He appears well-developed and well-nourished. No distress.  HENT:  Head: Normocephalic and atraumatic.  Left Ear: Tympanic membrane normal.  Mouth/Throat: Oropharynx is clear and moist.  Right ear canal occlusion by cerumen. Partial occlusion on the left  Eyes: Pupils are equal, round, and reactive to light. Conjunctivae and EOM are normal.  Neck: Normal range of motion. Neck supple.  Cardiovascular: Normal rate, regular rhythm and intact distal pulses.   No murmur heard. Pulmonary/Chest: Effort normal. Tachypnea noted. No respiratory distress. He has wheezes. He has no rales.  Abdominal: Soft. He exhibits no distension. There is no tenderness. There is no rebound and no guarding.  Musculoskeletal: Normal range of motion. He exhibits no edema or tenderness.  Neurological: He is alert and oriented to person, place, and time.  Skin: Skin is warm and dry. No rash noted. No erythema.  Psychiatric: He has a normal mood and affect. His behavior is normal.  Nursing note and vitals reviewed.    ED Treatments / Results  Labs (all labs ordered are listed, but only abnormal results are displayed) Labs Reviewed - No data to display  EKG  EKG Interpretation None       Radiology Dg Chest 2 View  Result Date: 03/07/2017 CLINICAL DATA:  Increased shortness of breath and congestion since 03/05/2017, history of asthma, using inhaler without relief, smoker EXAM: CHEST  2 VIEW COMPARISON:  08/20/2015 FINDINGS: Normal heart size, mediastinal contours, and pulmonary vascularity. Lungs mildly hyperinflated but clear. No pulmonary infiltrate, pleural effusion or pneumothorax. Bones unremarkable. IMPRESSION: Mild chronic hyperinflation without acute infiltrate. Electronically Signed   By: Ulyses SouthwardMark  Boles M.D.   On: 03/07/2017 11:26    Procedures Procedures  (including critical care time)  Medications Ordered in ED Medications  predniSONE (DELTASONE) tablet 60 mg (not administered)  albuterol (PROVENTIL) (2.5 MG/3ML) 0.083% nebulizer solution 5 mg (5 mg Nebulization Given 03/07/17 1114)  ipratropium (ATROVENT) nebulizer solution 0.5 mg (0.5 mg Nebulization Given 03/07/17 1114)     Initial Impression / Assessment and Plan / ED Course  I have reviewed the triage vital signs and the nursing notes.  Pertinent labs & imaging results that were available during my care of the patient were reviewed by me and considered in my medical decision making (see chart for details).     Pt with typical asthma exacerbation symptoms. Some infectious sx with productive cough.  Satting 100% on room air with a temperature of 99.6. Wheezing on exam.  will give steroids, albuterol/atrovent and recheck.  Chest x-ray pending.  12:15 PM Chest x-ray without signs of pneumonia. After first treatment patient's wheezing has resolved. We'll discharge home with prednisone. He has plenty of inhaler at home.   Final Clinical Impressions(s) / ED Diagnoses   Final diagnoses:  Viral upper respiratory tract infection  Exacerbation of persistent asthma, unspecified asthma severity    New Prescriptions New Prescriptions   PREDNISONE (DELTASONE) 20 MG TABLET    Take 2 tablets (40 mg total) by mouth daily.     Gwyneth SproutPlunkett, Brittani Purdum, MD 03/07/17 [phone]

## 2017-03-07 NOTE — ED Triage Notes (Signed)
Increased SOB since Sat. Hx of asthma. Using inhaler without relief. Speaking full sentences in triage

## 2017-05-28 ENCOUNTER — Encounter (HOSPITAL_BASED_OUTPATIENT_CLINIC_OR_DEPARTMENT_OTHER): Payer: Self-pay | Admitting: *Deleted

## 2017-05-28 ENCOUNTER — Emergency Department (HOSPITAL_BASED_OUTPATIENT_CLINIC_OR_DEPARTMENT_OTHER)
Admission: EM | Admit: 2017-05-28 | Discharge: 2017-05-28 | Disposition: A | Discharge status: Home / Self Care | Payer: BLUE CROSS/BLUE SHIELD | Attending: Emergency Medicine | Admitting: Emergency Medicine

## 2017-05-28 ENCOUNTER — Other Ambulatory Visit: Payer: Self-pay

## 2017-05-28 DIAGNOSIS — R300 Dysuria: Secondary | ICD-10-CM | POA: Diagnosis not present

## 2017-05-28 DIAGNOSIS — Z202 Contact with and (suspected) exposure to infections with a predominantly sexual mode of transmission: Secondary | ICD-10-CM

## 2017-05-28 DIAGNOSIS — J45909 Unspecified asthma, uncomplicated: Secondary | ICD-10-CM | POA: Insufficient documentation

## 2017-05-28 DIAGNOSIS — Z87891 Personal history of nicotine dependence: Secondary | ICD-10-CM | POA: Diagnosis not present

## 2017-05-28 LAB — URINALYSIS, ROUTINE W REFLEX MICROSCOPIC
Glucose, UA: NEGATIVE mg/dL
LEUKOCYTES UA: NEGATIVE
NITRITE: NEGATIVE
Protein, ur: NEGATIVE mg/dL
Specific Gravity, Urine: 1.025 (ref 1.005–1.030)
pH: 6 (ref 5.0–8.0)

## 2017-05-28 LAB — URINALYSIS, MICROSCOPIC (REFLEX)

## 2017-05-28 MED ORDER — LIDOCAINE HCL (PF) 1 % IJ SOLN
INTRAMUSCULAR | Status: AC
  Administered 2017-05-28: 1.2 mL
  Filled 2017-05-28: qty 5

## 2017-05-28 MED ORDER — CEFTRIAXONE SODIUM 250 MG IJ SOLR
250.0000 mg | Freq: Once | INTRAMUSCULAR | Status: AC
  Administered 2017-05-28: 250 mg via INTRAMUSCULAR
  Filled 2017-05-28: qty 250

## 2017-05-28 MED ORDER — AZITHROMYCIN 250 MG PO TABS
1000.0000 mg | ORAL_TABLET | Freq: Once | ORAL | Status: AC
Start: 2017-05-28 — End: 2017-05-28
  Administered 2017-05-28: 1000 mg via ORAL
  Filled 2017-05-28: qty 4

## 2017-05-28 NOTE — ED Triage Notes (Signed)
Pt requesting STD check , denies penile discharge

## 2017-05-28 NOTE — ED Provider Notes (Signed)
[facility] EMERGENCY DEPARTMENT Provider Note   CSN: [id number] Arrival date & time: 05/28/17  1253     History   Chief Complaint Chief Complaint  Patient presents with  . STD check    HPI Derek Moreno is a 31 y.o. male.  Patient requests std check. Notes recent new sexual contact. No known std exposure. ?slight dysuria. No penile discharge. No rash or skin lesions. No fever.      The history is provided by the patient.    Past Medical History:  Diagnosis Date  . Asthma     Patient Active Problem List   Diagnosis Date Noted  . Right flank pain 11/12/2015  . Dysuria 11/12/2015  . Pain in right thigh 11/12/2015  . Appendicolith 11/12/2015  . Tobacco abuse 11/12/2015    History reviewed. No pertinent surgical history.     Home Medications    Prior to Admission medications   Medication Sig Start Date End Date Taking? Authorizing Provider  albuterol (PROVENTIL HFA;VENTOLIN HFA) 108 (90 Base) MCG/ACT inhaler Inhale into the lungs every 6 (six) hours as needed for wheezing or shortness of breath.    [provider]    Family History No family history on file.  Social History Social History   Tobacco Use  . Smoking status: Former Smoker    Packs/day: 1.00  . Smokeless tobacco: Never Used  Substance Use Topics  . Alcohol use: No  . Drug use: Yes    Types: Marijuana    Comment: occasional     Allergies   Patient has no known allergies.   Review of Systems Review of Systems  Constitutional: Negative for fever.  Gastrointestinal: Negative for abdominal pain.  Genitourinary: Negative for discharge.  Skin: Negative for rash and wound.     Physical Exam Updated Vital Signs BP 135/63 (BP Location: Left Arm)   Pulse (!) 108   Temp 98.6 F (37 C)   Resp 16   Ht 1.753 m (5\' 9" )   Wt 65.8 kg (145 lb)   SpO2 100%   BMI 21.41 kg/m   Physical Exam  Constitutional: He appears well-developed and well-nourished. No distress.    HENT:  Mouth/Throat: Oropharynx is clear and moist.  Eyes: Conjunctivae are normal.  Neck: Neck supple. No tracheal deviation present.  Cardiovascular: Normal rate.  Pulmonary/Chest: Effort normal. No accessory muscle usage. No respiratory distress.  Abdominal: He exhibits no distension. There is no tenderness.  Genitourinary:  Genitourinary Comments: Normal external gu exam. No penile discharge.   Musculoskeletal: He exhibits no edema.  Neurological: He is alert.  Skin: Skin is warm and dry. No rash noted. He is not diaphoretic.  Psychiatric: He has a normal mood and affect.  Nursing note and vitals reviewed.    ED Treatments / Results  Labs (all labs ordered are listed, but only abnormal results are displayed) Results for orders placed or performed during the hospital encounter of 05/28/17  Urinalysis, Routine w reflex microscopic  Result Value Ref Range   Color, Urine AMBER (A) YELLOW   APPearance CLEAR CLEAR   Specific Gravity, Urine 1.025 1.005 - 1.030   pH 6.0 5.0 - 8.0   Glucose, UA NEGATIVE NEGATIVE mg/dL   Hgb urine dipstick LARGE (A) NEGATIVE   Bilirubin Urine MODERATE (A) NEGATIVE   Ketones, ur >80 (A) NEGATIVE mg/dL   Protein, ur NEGATIVE NEGATIVE mg/dL   Nitrite NEGATIVE NEGATIVE   Leukocytes, UA NEGATIVE NEGATIVE  Urinalysis, Microscopic (reflex)  Result Value  Ref Range   RBC / HPF 6-30 0 - 5 RBC/hpf   WBC, UA 0-5 0 - 5 WBC/hpf   Bacteria, UA MANY (A) NONE SEEN   Squamous Epithelial / LPF 0-5 (A) NONE SEEN   Mucus PRESENT     EKG  EKG Interpretation None       Radiology No results found.  Procedures Procedures (including critical care time)  Medications Ordered in ED Medications - No data to display   Initial Impression / Assessment and Plan / ED Course  I have reviewed the triage vital signs and the nursing notes.  Pertinent labs & imaging results that were available during my care of the patient were reviewed by me and considered in my  medical decision making (see chart for details).  Urethral swab sent.   ?mild dysuria. Recent new sexual contact. Will offer tx. rocphin im. zithromax po.   rec safer sex practices.   Labs pending.   pcp f/u.    Final Clinical Impressions(s) / ED Diagnoses   Final diagnoses:  None    ED Discharge Orders    None       Cathren LaineSteinl, Kentravious Lipford, MD 05/28/17 1410

## 2017-05-28 NOTE — Discharge Instructions (Signed)
It was our pleasure to provide your ER care today - we hope that you feel better.  Follow up with primary care doctor in 1 week if symptoms fail to improve/resolve.

## 2017-05-30 LAB — GC/CHLAMYDIA PROBE AMP (~~LOC~~) NOT AT ARMC
CHLAMYDIA, DNA PROBE: NEGATIVE
NEISSERIA GONORRHEA: NEGATIVE

## 2017-06-27 IMAGING — CT CT RENAL STONE PROTOCOL
2 of 4 series · 17 of 46 positions shown, 19 images · non-contrast
Comparison: None

CLINICAL DATA: RIGHT flank pain for 3 days, dysuria, asthma,
smoker, initial encounter

EXAM:
CT ABDOMEN AND PELVIS WITHOUT CONTRAST
TECHNIQUE: Multidetector CT imaging of the abdomen and pelvis was performed
following the standard protocol without IV contrast. Sagittal and
coronal MPR images reconstructed from axial data set. Oral contrast
not administered for this indication.

[Series 2: axial st · axial · 0.71mm/px · z∈[-362,-7]mm · 14 of 77 slices shown, 16 images]
[im 3/77  soft-tissue]
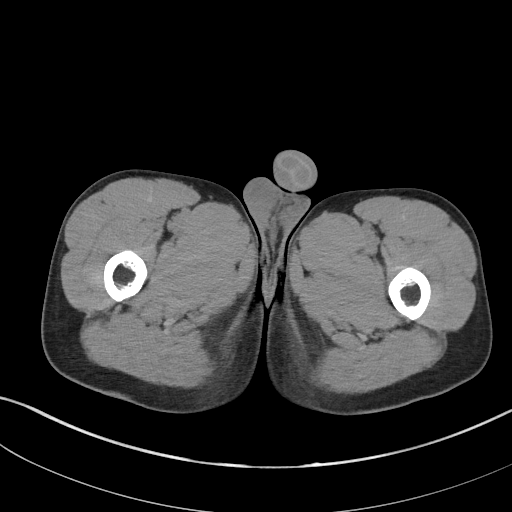
[im 3/77  bone]
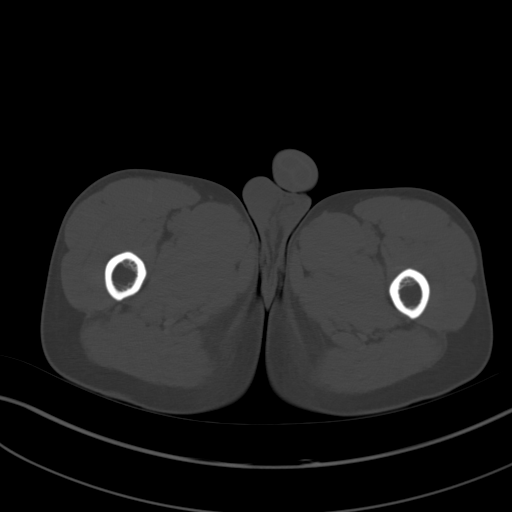
[im 9/77  soft-tissue]
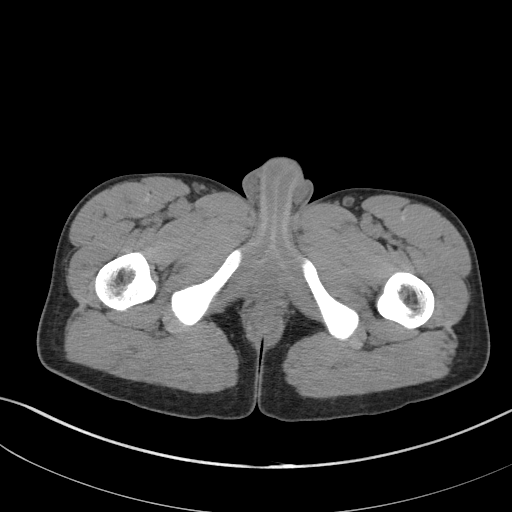
[im 15/77  soft-tissue]
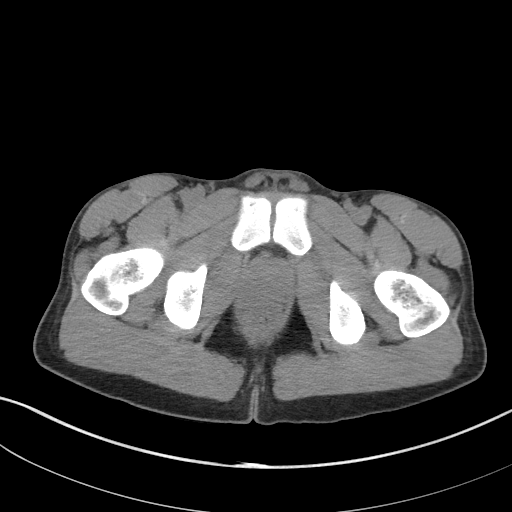
[im 21/77  soft-tissue]
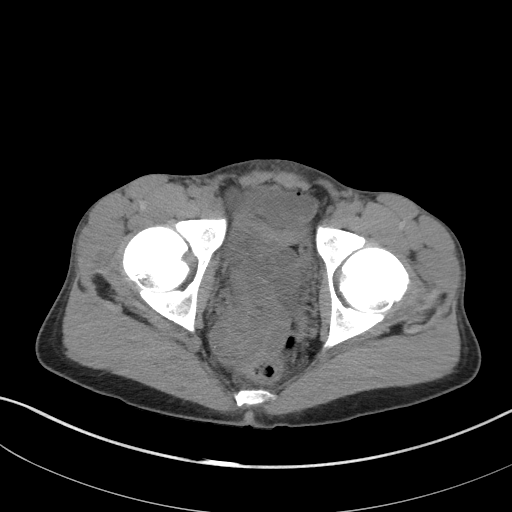
[im 27/77  soft-tissue]
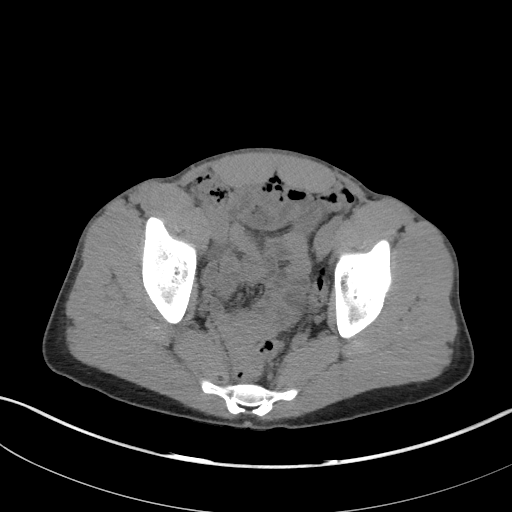
[im 30/77  soft-tissue]
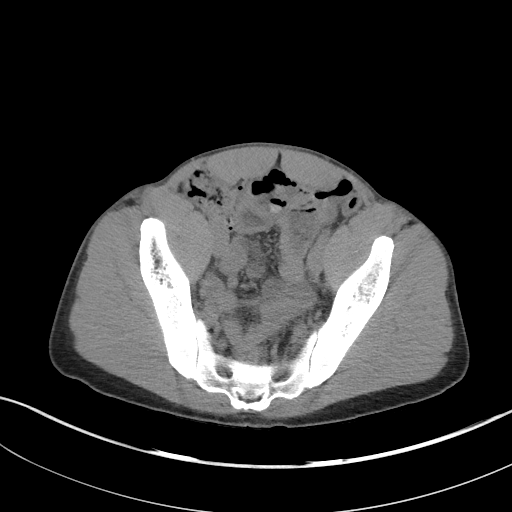
[im 36/77  soft-tissue]
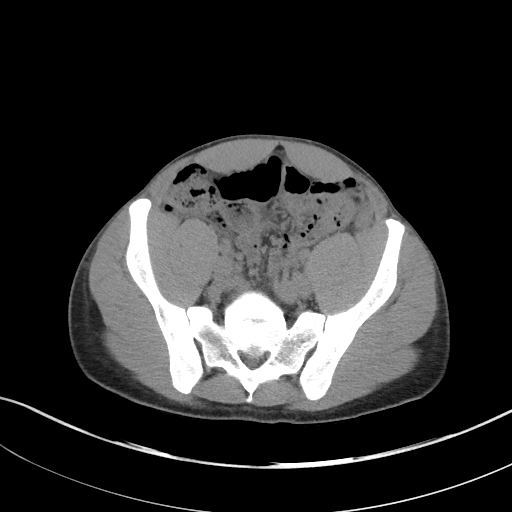
[im 41/77  soft-tissue]
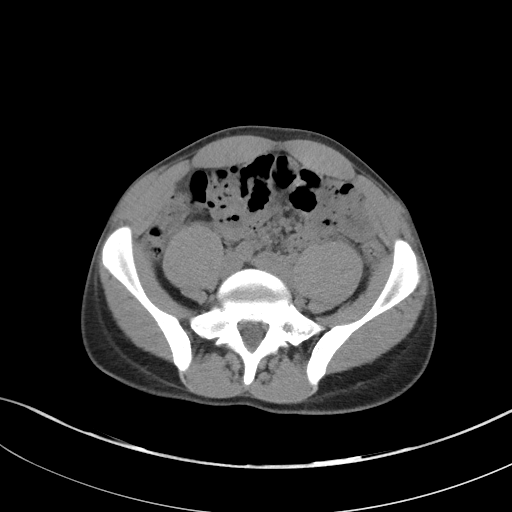
[im 47/77  soft-tissue]
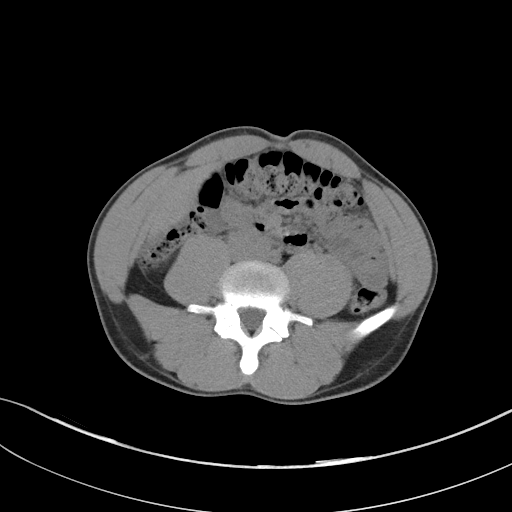
[im 47/77  bone]
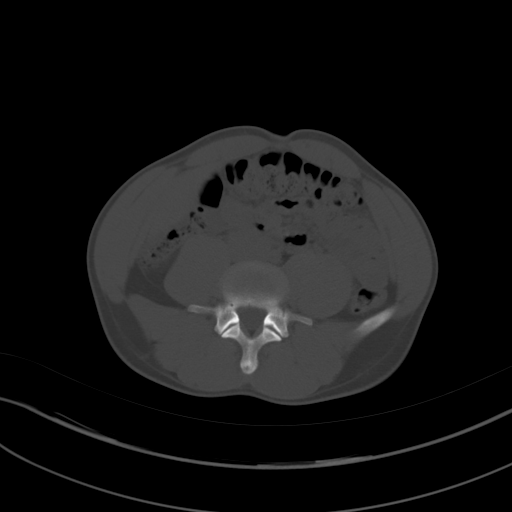
[im 50/77  soft-tissue]
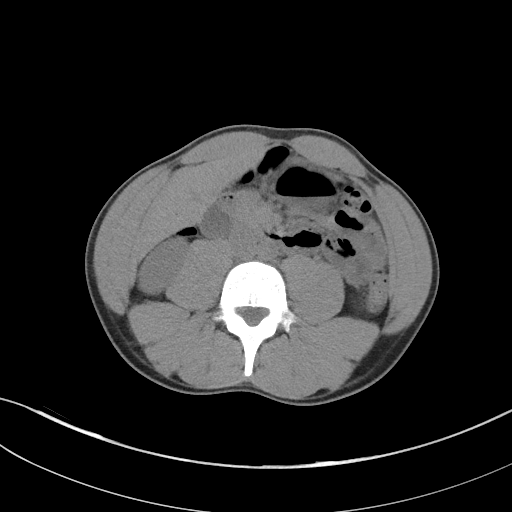
[im 56/77  soft-tissue]
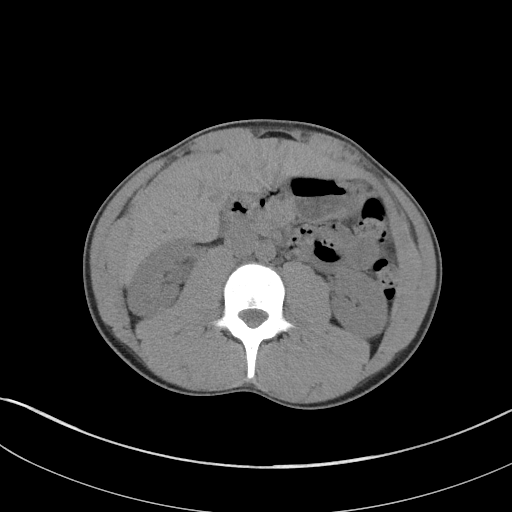
[im 62/77  soft-tissue]
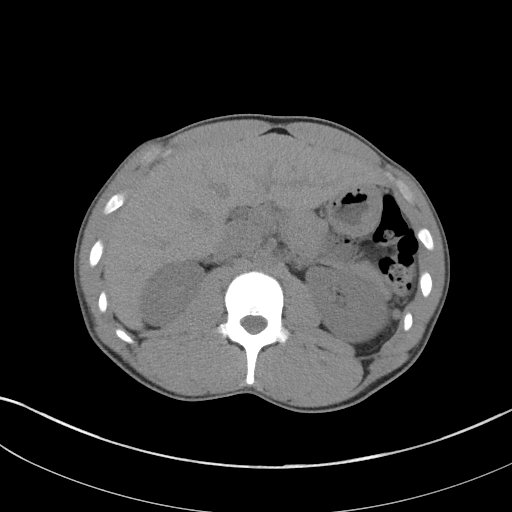
[im 68/77  soft-tissue]
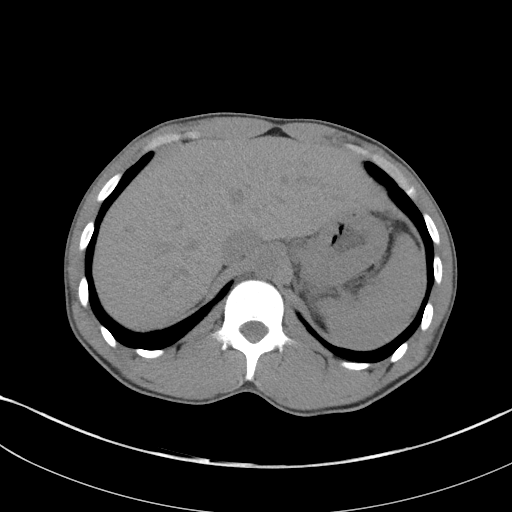
[im 74/77  soft-tissue]
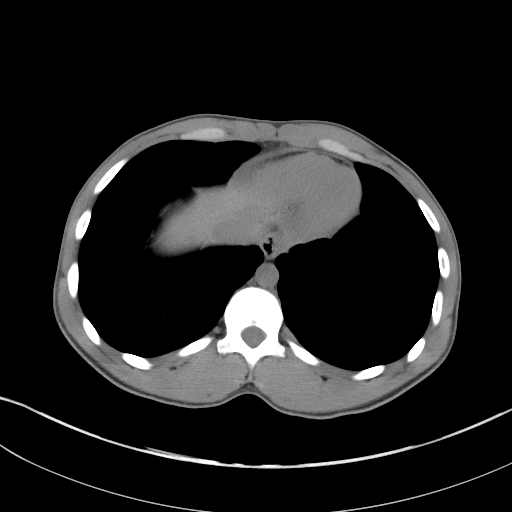

[Series 5: coronal st · coronal · 0.69mm/px · 3 of 74 slices shown]
[im 25/74  soft-tissue]
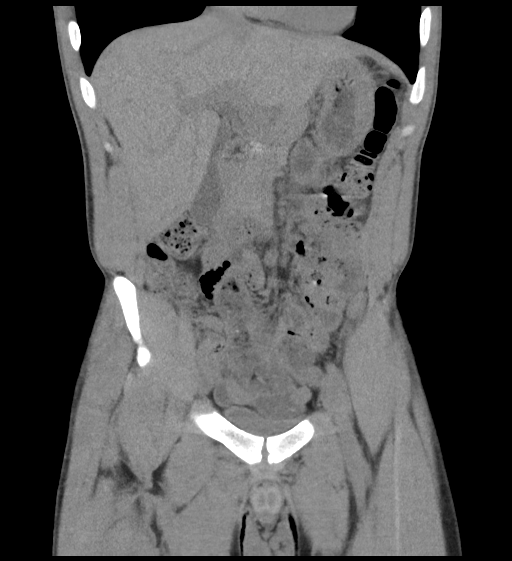
[im 33/74  soft-tissue]
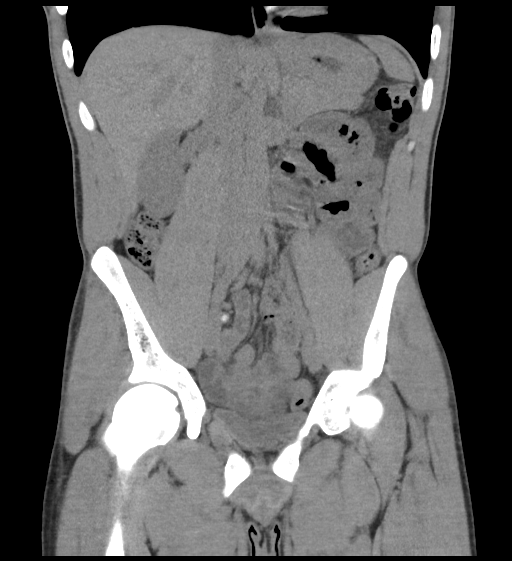
[im 41/74  soft-tissue]
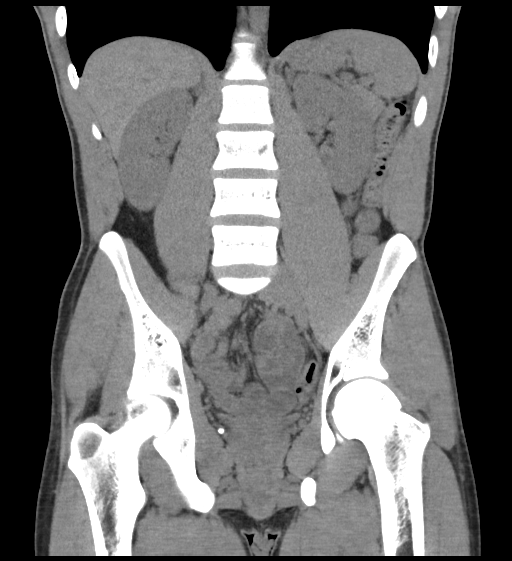

[17 of 46 positions shown; findings below may reference images not displayed]

FINDINGS: Lung bases clear.

Kidneys unremarkable.

No urinary tract calcification, hydronephrosis or ureteral
dilatation.

Bladder decompressed.

Within limits of noncontrast technique, no focal abnormalities of
the liver, gallbladder, spleen, pancreas, kidneys or adrenal glands.

Appendicolith within appendix with distal appendix prominent size at
7-8 mm diameter; however no definite periappendiceal inflammatory
changes are identified.

Stomach and bowel loops otherwise normal appearance for noncontrast
technique.

No mass, adenopathy, free air, free fluid or hernia.

Osseous structures normal appearance.
IMPRESSION: No evidence of urinary tract calcification or dilatation.

Appendicolith within appendix with distal mild enlargement of the
appendix measuring 7-8 mm diameter ; while no definite
periappendiceal inflammatory changes are identified, would recommend
clinical exclusion of appendicitis.

Remainder of exam unremarkable.

## 2017-10-07 ENCOUNTER — Encounter (HOSPITAL_BASED_OUTPATIENT_CLINIC_OR_DEPARTMENT_OTHER): Payer: Self-pay | Admitting: Emergency Medicine

## 2017-10-07 ENCOUNTER — Other Ambulatory Visit: Payer: Self-pay

## 2017-10-07 ENCOUNTER — Emergency Department (HOSPITAL_BASED_OUTPATIENT_CLINIC_OR_DEPARTMENT_OTHER)
Admission: EM | Admit: 2017-10-07 | Discharge: 2017-10-07 | Disposition: A | Discharge status: Home / Self Care | Payer: BLUE CROSS/BLUE SHIELD | Attending: Emergency Medicine | Admitting: Emergency Medicine

## 2017-10-07 ENCOUNTER — Emergency Department (HOSPITAL_BASED_OUTPATIENT_CLINIC_OR_DEPARTMENT_OTHER): Payer: BLUE CROSS/BLUE SHIELD

## 2017-10-07 DIAGNOSIS — Y998 Other external cause status: Secondary | ICD-10-CM | POA: Diagnosis not present

## 2017-10-07 DIAGNOSIS — Z87891 Personal history of nicotine dependence: Secondary | ICD-10-CM | POA: Insufficient documentation

## 2017-10-07 DIAGNOSIS — S139XXA Sprain of joints and ligaments of unspecified parts of neck, initial encounter: Secondary | ICD-10-CM | POA: Insufficient documentation

## 2017-10-07 DIAGNOSIS — Z79899 Other long term (current) drug therapy: Secondary | ICD-10-CM | POA: Insufficient documentation

## 2017-10-07 DIAGNOSIS — W51XXXA Accidental striking against or bumped into by another person, initial encounter: Secondary | ICD-10-CM | POA: Diagnosis not present

## 2017-10-07 DIAGNOSIS — Y929 Unspecified place or not applicable: Secondary | ICD-10-CM | POA: Insufficient documentation

## 2017-10-07 DIAGNOSIS — S199XXA Unspecified injury of neck, initial encounter: Secondary | ICD-10-CM | POA: Diagnosis present

## 2017-10-07 DIAGNOSIS — Y9372 Activity, wrestling: Secondary | ICD-10-CM | POA: Diagnosis not present

## 2017-10-07 DIAGNOSIS — J45909 Unspecified asthma, uncomplicated: Secondary | ICD-10-CM | POA: Diagnosis not present

## 2017-10-07 MED ORDER — HYDROCODONE-ACETAMINOPHEN 5-325 MG PO TABS: 1.0000 | ORAL_TABLET | Freq: Four times a day (QID) | ORAL | 0 refills | Status: DC | PRN

## 2017-10-07 MED ORDER — CYCLOBENZAPRINE HCL 10 MG PO TABS: 10.0000 mg | ORAL_TABLET | Freq: Three times a day (TID) | ORAL | 0 refills | Status: DC | PRN

## 2017-10-07 NOTE — ED Provider Notes (Addendum)
[facility] Provider Note: Derek DellJ. Lane Koren Plyler, MD, FACEP  CSN: [id number] MRN: [id number] ARRIVAL: 10/07/17 at 0527 ROOM: [facility]   CHIEF COMPLAINT  Neck Injury   HISTORY OF PRESENT ILLNESS  10/07/17 5:41 AM Derek Moreno is a 32 y.o. male who was wrestling last night about 9 PM when he felt a pain in his neck.  The pain is located primarily on the left side of his neck.  He rates it as a 10 out of 10.  It is worse with movement of his neck, notably rotation to the left.  The pain radiates to his left trapezius area.  He denies any associated pain or paresthesias in his arms or legs.  [facility] with [facility] [place] substances [place] reveals the patient has received one prescription for 10 Tylenol No. 3 tablets in the past year..   Past Medical History:  Diagnosis Date  . Asthma     History reviewed. No pertinent surgical history.  No family history on file.  Social History   Tobacco Use  . Smoking status: Former Smoker    Packs/day: 1.00  . Smokeless tobacco: Never Used  Substance Use Topics  . Alcohol use: No  . Drug use: Yes    Types: Marijuana    Comment: occasional    Prior to Admission medications   Medication Sig Start Date End Date Taking? Authorizing Provider  albuterol (PROVENTIL HFA;VENTOLIN HFA) 108 (90 Base) MCG/ACT inhaler Inhale into the lungs every 6 (six) hours as needed for wheezing or shortness of breath.    [provider]    Allergies Patient has no known allergies.   REVIEW OF SYSTEMS  Negative except as noted here or in the History of Present Illness.   PHYSICAL EXAMINATION  Initial Vital Signs Blood pressure (!) 136/92, pulse 75, temperature 98.4 F (36.9 C), temperature source Oral, resp. rate 16, height 5\' 9"  (1.753 m), weight 61.2 kg (135 lb), SpO2 99 %.  Examination General: Well-developed, well-nourished male in no acute distress; appearance consistent with age of record HENT:  normocephalic; atraumatic Eyes: pupils equal, round and reactive to light; extraocular muscles intact Neck: Posterior and posterolateral tenderness with palpable muscle spasm Heart: regular rate and rhythm Lungs: clear to auscultation bilaterally Abdomen: soft; nondistended; nontender; bowel sounds present Extremities: No deformity; full range of motion Neurologic: Awake, alert and oriented; motor function intact in all extremities and symmetric; no facial droop Skin: Warm and dry Psychiatric: Flat affect   RESULTS  Summary of this visit's results, reviewed by myself:   EKG Interpretation  Date/Time:    Ventricular Rate:    PR Interval:    QRS Duration:   QT Interval:    QTC Calculation:   R Axis:     Text Interpretation:        Laboratory Studies: No results found for this or any previous visit (from the past 24 hour(s)). Imaging Studies: Ct Cervical Spine Wo Contrast  Result Date: 10/07/2017 CLINICAL DATA:  Neck and left shoulder pain after injury 8 hours ago. EXAM: CT CERVICAL SPINE WITHOUT CONTRAST TECHNIQUE: Multidetector CT imaging of the cervical spine was performed without intravenous contrast. Multiplanar CT image reconstructions were also generated. COMPARISON:  None. FINDINGS: Alignment: Reversal of normal lordosis with mild broad-based leftward curvature. No traumatic subluxation. No acute fracture. Vertebral body heights are maintained. The dens and skull base are intact. Skull base and vertebrae: No acute fracture. Vertebral body heights are maintained. The dens and skull base are  intact. Soft tissues and spinal canal: No prevertebral fluid or swelling. No visible canal hematoma. Disc levels: Possible central disc protrusions at C3-C4 and C4-C5. Mild C4-C5 endplate spurring. Upper chest: No acute abnormality. Other: None. IMPRESSION: 1. No acute fracture or subluxation of the cervical spine. 2. Possible central disc protrusions at C3-C4 and C4-C5. Consider MRI.  Electronically Signed   By: Rubye Oaks M.D.   On: 10/07/2017 06:29    ED COURSE  Nursing notes and initial vitals signs, including pulse oximetry, reviewed.  Vitals:   10/07/17 0537 10/07/17 0538  BP: (!) 136/92   Pulse: 75   Resp: 16   Temp: 98.4 F (36.9 C)   TempSrc: Oral   SpO2: 99%   Weight:  61.2 kg (135 lb)  Height:  5\' 9"  (1.753 m)    PROCEDURES    ED DIAGNOSES     ICD-10-CM   1. Acute cervical sprain, initial encounter S13.9XXA        Makelle Marrone, Jonny Ruiz, MD 10/07/17 0645    Paula Libra, MD 10/07/17 [phone]

## 2017-10-07 NOTE — ED Triage Notes (Signed)
States," I was tussling around last night and hurt my neck" Pain to left side of neck with movement, denies LOC

## 2017-10-13 ENCOUNTER — Encounter (HOSPITAL_BASED_OUTPATIENT_CLINIC_OR_DEPARTMENT_OTHER): Payer: Self-pay

## 2017-10-13 ENCOUNTER — Other Ambulatory Visit: Payer: Self-pay

## 2017-10-13 ENCOUNTER — Emergency Department (HOSPITAL_BASED_OUTPATIENT_CLINIC_OR_DEPARTMENT_OTHER)
Admission: EM | Admit: 2017-10-13 | Discharge: 2017-10-13 | Disposition: A | Discharge status: Home / Self Care | Payer: BLUE CROSS/BLUE SHIELD | Attending: Emergency Medicine | Admitting: Emergency Medicine

## 2017-10-13 DIAGNOSIS — Z87891 Personal history of nicotine dependence: Secondary | ICD-10-CM | POA: Diagnosis not present

## 2017-10-13 DIAGNOSIS — F1210 Cannabis abuse, uncomplicated: Secondary | ICD-10-CM | POA: Insufficient documentation

## 2017-10-13 DIAGNOSIS — M542 Cervicalgia: Secondary | ICD-10-CM | POA: Diagnosis present

## 2017-10-13 DIAGNOSIS — M436 Torticollis: Secondary | ICD-10-CM | POA: Diagnosis not present

## 2017-10-13 DIAGNOSIS — J45909 Unspecified asthma, uncomplicated: Secondary | ICD-10-CM | POA: Diagnosis not present

## 2017-10-13 MED ORDER — NAPROXEN 500 MG PO TABS: 500.0000 mg | ORAL_TABLET | Freq: Two times a day (BID) | ORAL | 0 refills | Status: DC

## 2017-10-13 MED ORDER — METHOCARBAMOL 500 MG PO TABS: 500.0000 mg | ORAL_TABLET | Freq: Three times a day (TID) | ORAL | 0 refills | Status: DC | PRN

## 2017-10-13 MED ORDER — METHOCARBAMOL 500 MG PO TABS
1000.0000 mg | ORAL_TABLET | Freq: Once | ORAL | Status: AC
  Administered 2017-10-13: 1000 mg via ORAL
  Filled 2017-10-13: qty 2

## 2017-10-13 MED ORDER — TRAMADOL HCL 50 MG PO TABS: 50.0000 mg | ORAL_TABLET | Freq: Four times a day (QID) | ORAL | 0 refills | Status: DC | PRN

## 2017-10-13 MED ORDER — TRAMADOL HCL 50 MG PO TABS
50.0000 mg | ORAL_TABLET | Freq: Once | ORAL | Status: AC
Start: 2017-10-13 — End: 2017-10-13
  Administered 2017-10-13: 50 mg via ORAL
  Filled 2017-10-13: qty 1

## 2017-10-13 MED FILL — METHOCARBAMOL 500 MG TABLET: 500 | 6 days supply | Qty: 20 | Fill #0

## 2017-10-13 MED FILL — NAPROXEN 500 MG TABLET: 500 | 15 days supply | Qty: 30 | Fill #0

## 2017-10-13 MED FILL — traMADol HCL 50 MG TABS: 50 | 3 days supply | Qty: 15 | Fill #0

## 2017-10-13 NOTE — ED Provider Notes (Signed)
[facility] EMERGENCY DEPARTMENT Provider Note   CSN: [id number] Arrival date & time: 10/13/17  1602     History   Chief Complaint Chief Complaint  Patient presents with  . Neck Pain    HPI Maitland Lesiak is a 32 y.o. male.  Chief complaint is continued neck pain.  HPI 32 year old male.  Seen about 6 days ago.  Had been wrestling with a friend.  He rinsed his neck.  Diagnosed with a cervical strain.  Given hydrocodone, and cyclobenzaprine.  Given follow-up with Dr. Pearletha Forge.  Called today to make appointment because his symptoms were persisting.  Could not be seen today, so he presents here.  View of the [employer] on his previous visit showed one prescription in the last 12 months for Tylenol 3.  His symptoms are not worse.  He states are just not improving he cannot rotate to his left without discomfort.  Pain is in his trapezius and posterior lateral left neck.  No pain down his arm.  Does not have symptoms to the right arm or symptoms of electrical shocks or symptoms down his spine.  No bowel or bladder symptoms.  Normal gait.  No leg symptoms.  Past Medical History:  Diagnosis Date  . Asthma     Patient Active Problem List   Diagnosis Date Noted  . Right flank pain 11/12/2015  . Dysuria 11/12/2015  . Pain in right thigh 11/12/2015  . Appendicolith 11/12/2015  . Tobacco abuse 11/12/2015    History reviewed. No pertinent surgical history.      Home Medications    Prior to Admission medications   Medication Sig Start Date End Date Taking? Authorizing Provider  albuterol (PROVENTIL HFA;VENTOLIN HFA) 108 (90 Base) MCG/ACT inhaler Inhale into the lungs every 6 (six) hours as needed for wheezing or shortness of breath.    [provider]  cyclobenzaprine (FLEXERIL) 10 MG tablet Take 1 tablet (10 mg total) by mouth 3 (three) times daily as needed for muscle spasms. 10/07/17   Molpus, John, MD  HYDROcodone-acetaminophen (NORCO) 5-325 MG  tablet Take 1 tablet by mouth every 6 (six) hours as needed (for pain). 10/07/17   Molpus, John, MD  methocarbamol (ROBAXIN) 500 MG tablet Take 1 tablet (500 mg total) by mouth 3 (three) times daily between meals as needed. 10/13/17   Rolland Porter, MD  naproxen (NAPROSYN) 500 MG tablet Take 1 tablet (500 mg total) by mouth 2 (two) times daily. 10/13/17   Rolland Porter, MD  traMADol (ULTRAM) 50 MG tablet Take 1 tablet (50 mg total) by mouth every 6 (six) hours as needed. 10/13/17   Rolland Porter, MD    Family History No family history on file.  Social History Social History   Tobacco Use  . Smoking status: Former Smoker    Packs/day: 1.00  . Smokeless tobacco: Never Used  Substance Use Topics  . Alcohol use: No  . Drug use: Yes    Types: Marijuana    Comment: occasional     Allergies   Patient has no known allergies.   Review of Systems Review of Systems  Constitutional: Negative for appetite change, chills, diaphoresis, fatigue and fever.  HENT: Negative for mouth sores, sore throat and trouble swallowing.   Eyes: Negative for visual disturbance.  Respiratory: Negative for cough, chest tightness, shortness of breath and wheezing.   Cardiovascular: Negative for chest pain.  Gastrointestinal: Negative for abdominal distention, abdominal pain, diarrhea, nausea and vomiting.  Endocrine: Negative for polydipsia,  polyphagia and polyuria.  Genitourinary: Negative for dysuria, frequency and hematuria.  Musculoskeletal: Positive for neck pain and neck stiffness. Negative for gait problem.  Skin: Negative for color change, pallor and rash.  Neurological: Negative for dizziness, syncope, light-headedness and headaches.  Hematological: Does not bruise/bleed easily.  Psychiatric/Behavioral: Negative for behavioral problems and confusion.     Physical Exam Updated Vital Signs BP 140/86 (BP Location: Right Arm)   Pulse 81   Temp 98.2 F (36.8 C) (Oral)   Resp 17   Ht 5\' 9"  (1.753 m)    Wt 61.2 kg (135 lb)   SpO2 99%   BMI 19.94 kg/m   Physical Exam  Constitutional: He is oriented to person, place, and time. He appears well-developed and well-nourished. No distress.  HENT:  Head: Normocephalic.  Eyes: Pupils are equal, round, and reactive to light. Conjunctivae are normal. No scleral icterus.  Neck: Normal range of motion. Neck supple. No thyromegaly present.  Cardiovascular: Normal rate and regular rhythm. Exam reveals no gallop and no friction rub.  No murmur heard. Pulmonary/Chest: Effort normal and breath sounds normal. No respiratory distress. He has no wheezes. He has no rales.  Abdominal: Soft. Bowel sounds are normal. He exhibits no distension. There is no tenderness. There is no rebound.  Musculoskeletal:  Tender palpate in the left neck.  He has normal neurological exam of the bilateral upper extremities.  He has difficulty rotating the left.  Negative Spurling's.  Neurological: He is alert and oriented to person, place, and time.  Skin: Skin is warm and dry. No rash noted.  Psychiatric: He has a normal mood and affect. His behavior is normal.     ED Treatments / Results  Labs (all labs ordered are listed, but only abnormal results are displayed) Labs Reviewed - No data to display  EKG None  Radiology No results found.  Procedures Procedures (including critical care time)  Medications Ordered in ED Medications  methocarbamol (ROBAXIN) tablet 1,000 mg (has no administration in time range)  traMADol (ULTRAM) tablet 50 mg (has no administration in time range)     Initial Impression / Assessment and Plan / ED Course  I have reviewed the triage vital signs and the nursing notes.  Pertinent labs & imaging results that were available during my care of the patient were reviewed by me and considered in my medical decision making (see chart for details).   No symptoms or findings that would suggest need for neurological imaging at this time.  Have  encouraged him to call and make appointment for follow-up.  He may need some intervention with physical therapy.  Plan Ultram, Robaxin, naproxen.  Follow-up as above.  Final Clinical Impressions(s) / ED Diagnoses   Final diagnoses:  Torticollis, acute    ED Discharge Orders        Ordered    naproxen (NAPROSYN) 500 MG tablet  2 times daily     10/13/17 1708    traMADol (ULTRAM) 50 MG tablet  Every 6 hours PRN     10/13/17 1708    methocarbamol (ROBAXIN) 500 MG tablet  3 times daily between meals PRN     10/13/17 1708       Rolland PorterJames, Oluwatomisin Hustead, MD 10/13/17 1711

## 2017-10-13 NOTE — Discharge Instructions (Signed)
Call Dr. Pearletha ForgeHudnall tomorrow morning to make appointment for follow-up Ultram for pain, Robaxin and muscle relaxant. Naproxen, anti-inflammatory.  As prescribed.

## 2017-10-13 NOTE — ED Triage Notes (Signed)
Pt presents with left sided neck pain that radiates into his back. Pt seen last week for same.

## 2018-01-02 ENCOUNTER — Ambulatory Visit: Payer: BLUE CROSS/BLUE SHIELD | Admitting: Family Medicine

## 2018-02-02 ENCOUNTER — Other Ambulatory Visit: Payer: Self-pay

## 2018-02-02 ENCOUNTER — Emergency Department (HOSPITAL_BASED_OUTPATIENT_CLINIC_OR_DEPARTMENT_OTHER)
Admission: EM | Admit: 2018-02-02 | Discharge: 2018-02-02 | Disposition: A | Discharge status: Home / Self Care | Payer: BLUE CROSS/BLUE SHIELD | Attending: Emergency Medicine | Admitting: Emergency Medicine

## 2018-02-02 ENCOUNTER — Encounter (HOSPITAL_BASED_OUTPATIENT_CLINIC_OR_DEPARTMENT_OTHER): Payer: Self-pay | Admitting: *Deleted

## 2018-02-02 DIAGNOSIS — J45909 Unspecified asthma, uncomplicated: Secondary | ICD-10-CM | POA: Insufficient documentation

## 2018-02-02 DIAGNOSIS — N341 Nonspecific urethritis: Secondary | ICD-10-CM | POA: Insufficient documentation

## 2018-02-02 DIAGNOSIS — N342 Other urethritis: Secondary | ICD-10-CM

## 2018-02-02 DIAGNOSIS — Z87891 Personal history of nicotine dependence: Secondary | ICD-10-CM | POA: Diagnosis not present

## 2018-02-02 DIAGNOSIS — R300 Dysuria: Secondary | ICD-10-CM | POA: Diagnosis present

## 2018-02-02 LAB — URINALYSIS, ROUTINE W REFLEX MICROSCOPIC
BILIRUBIN URINE: NEGATIVE
GLUCOSE, UA: NEGATIVE mg/dL
KETONES UR: NEGATIVE mg/dL
Leukocytes, UA: NEGATIVE
Nitrite: NEGATIVE
Protein, ur: NEGATIVE mg/dL
Specific Gravity, Urine: 1.02 (ref 1.005–1.030)
pH: 6.5 (ref 5.0–8.0)

## 2018-02-02 LAB — URINALYSIS, MICROSCOPIC (REFLEX)

## 2018-02-02 MED ORDER — AZITHROMYCIN 250 MG PO TABS
1000.0000 mg | ORAL_TABLET | Freq: Once | ORAL | Status: AC
  Administered 2018-02-02: 1000 mg via ORAL
  Filled 2018-02-02: qty 4

## 2018-02-02 MED ORDER — CEFTRIAXONE SODIUM 250 MG IJ SOLR
250.0000 mg | Freq: Once | INTRAMUSCULAR | Status: AC
  Administered 2018-02-02: 250 mg via INTRAMUSCULAR
  Filled 2018-02-02: qty 250

## 2018-02-02 NOTE — ED Triage Notes (Signed)
Pt c/o penis pain with urination , denies discharge

## 2018-02-02 NOTE — ED Notes (Signed)
Pt verbalizes understanding of d/c instructions and denies any further needs at this time. 

## 2018-02-02 NOTE — ED Provider Notes (Signed)
[facility] EMERGENCY DEPARTMENT Provider Note   CSN: [id number] Arrival date & time: 02/02/18  0002     History   Chief Complaint Chief Complaint  Patient presents with  . Dysuria    HPI Derek Moreno is a 32 y.o. male.  HPI Pain with urination x 48 hours. No urethral discharge. No scrotal pain or fever. No abd pain. Recent sexual intercourse without condom. Concerned about STD. No hx of UTI. No flank pain. Symptoms are mild   Past Medical History:  Diagnosis Date  . Asthma     Patient Active Problem List   Diagnosis Date Noted  . Right flank pain 11/12/2015  . Dysuria 11/12/2015  . Pain in right thigh 11/12/2015  . Appendicolith 11/12/2015  . Tobacco abuse 11/12/2015    History reviewed. No pertinent surgical history.      Home Medications    Prior to Admission medications   Medication Sig Start Date End Date Taking? Authorizing Provider  albuterol (PROVENTIL HFA;VENTOLIN HFA) 108 (90 Base) MCG/ACT inhaler Inhale into the lungs every 6 (six) hours as needed for wheezing or shortness of breath.    [provider]    Family History History reviewed. No pertinent family history.  Social History Social History   Tobacco Use  . Smoking status: Former Smoker    Packs/day: 1.00  . Smokeless tobacco: Never Used  Substance Use Topics  . Alcohol use: No  . Drug use: Yes    Types: Marijuana    Comment: occasional     Allergies   Patient has no known allergies.   Review of Systems Review of Systems  All other systems reviewed and are negative.    Physical Exam Updated Vital Signs BP 127/80   Pulse 73   Temp 98.5 F (36.9 C)   Resp 18   Ht 5\' 9"  (1.753 m)   Wt 63.5 kg (140 lb)   SpO2 97%   BMI 20.67 kg/m   Physical Exam  Constitutional: He is oriented to person, place, and time. He appears well-developed and well-nourished.  HENT:  Head: Normocephalic.  Eyes: EOM are normal.  Neck: Normal range of motion.    Pulmonary/Chest: Effort normal.  Abdominal: Soft. He exhibits no distension.  Genitourinary: Penis normal.  Genitourinary Comments: Scrotum normal. No urethral discharge noted  Musculoskeletal: Normal range of motion.  Neurological: He is alert and oriented to person, place, and time.  Psychiatric: He has a normal mood and affect.  Nursing note and vitals reviewed.    ED Treatments / Results  Labs (all labs ordered are listed, but only abnormal results are displayed) Labs Reviewed  URINALYSIS, ROUTINE W REFLEX MICROSCOPIC - Abnormal; Notable for the following components:      Result Value   Color, Urine AMBER (*)    Hgb urine dipstick TRACE (*)    All other components within normal limits  URINALYSIS, MICROSCOPIC (REFLEX) - Abnormal; Notable for the following components:   Bacteria, UA MANY (*)    All other components within normal limits  GC/CHLAMYDIA PROBE AMP [facility]    EKG None  Radiology No results found.  Procedures Procedures (including critical care time)  Medications Ordered in ED Medications  cefTRIAXone (ROCEPHIN) injection 250 mg (250 mg Intramuscular Given 02/02/18 0036)  azithromycin (ZITHROMAX) tablet 1,000 mg (1,000 mg Oral Given 02/02/18 0036)     Initial Impression / Assessment and Plan / ED Course  I have reviewed the triage vital signs and  the nursing notes.  Pertinent labs & imaging results that were available during my care of the patient were reviewed by me and considered in my medical decision making (see chart for details).     Dc home in good condition. Treated for STI. Doubt UTI  Final Clinical Impressions(s) / ED Diagnoses   Final diagnoses:  Urethritis    ED Discharge Orders    None       Azalia Bilis, MD 02/02/18 [phone]

## 2018-02-03 LAB — GC/CHLAMYDIA PROBE AMP (~~LOC~~) NOT AT ARMC
CHLAMYDIA, DNA PROBE: POSITIVE — AB
NEISSERIA GONORRHEA: NEGATIVE

## 2018-02-06 ENCOUNTER — Ambulatory Visit: Payer: BLUE CROSS/BLUE SHIELD | Admitting: Family Medicine

## 2018-02-06 DIAGNOSIS — Z0289 Encounter for other administrative examinations: Secondary | ICD-10-CM

## 2018-04-03 ENCOUNTER — Other Ambulatory Visit: Payer: Self-pay

## 2018-04-03 ENCOUNTER — Emergency Department (HOSPITAL_BASED_OUTPATIENT_CLINIC_OR_DEPARTMENT_OTHER)
Admission: EM | Admit: 2018-04-03 | Discharge: 2018-04-03 | Disposition: A | Discharge status: Home / Self Care | Payer: BLUE CROSS/BLUE SHIELD | Attending: Emergency Medicine | Admitting: Emergency Medicine

## 2018-04-03 ENCOUNTER — Encounter (HOSPITAL_BASED_OUTPATIENT_CLINIC_OR_DEPARTMENT_OTHER): Payer: Self-pay | Admitting: *Deleted

## 2018-04-03 DIAGNOSIS — J45909 Unspecified asthma, uncomplicated: Secondary | ICD-10-CM | POA: Insufficient documentation

## 2018-04-03 DIAGNOSIS — R360 Urethral discharge without blood: Secondary | ICD-10-CM | POA: Diagnosis present

## 2018-04-03 DIAGNOSIS — Z87891 Personal history of nicotine dependence: Secondary | ICD-10-CM | POA: Insufficient documentation

## 2018-04-03 DIAGNOSIS — R300 Dysuria: Secondary | ICD-10-CM | POA: Insufficient documentation

## 2018-04-03 DIAGNOSIS — Z202 Contact with and (suspected) exposure to infections with a predominantly sexual mode of transmission: Secondary | ICD-10-CM | POA: Diagnosis not present

## 2018-04-03 LAB — URINALYSIS, ROUTINE W REFLEX MICROSCOPIC
Bilirubin Urine: NEGATIVE
Glucose, UA: NEGATIVE mg/dL
Ketones, ur: NEGATIVE mg/dL
Nitrite: NEGATIVE
PROTEIN: NEGATIVE mg/dL
SPECIFIC GRAVITY, URINE: 1.02 (ref 1.005–1.030)
pH: 7 (ref 5.0–8.0)

## 2018-04-03 LAB — URINALYSIS, MICROSCOPIC (REFLEX)

## 2018-04-03 MED ORDER — METRONIDAZOLE 500 MG PO TABS
2000.0000 mg | ORAL_TABLET | Freq: Once | ORAL | Status: AC
  Administered 2018-04-03: 2000 mg via ORAL
  Filled 2018-04-03: qty 4

## 2018-04-03 MED ORDER — CEFTRIAXONE SODIUM 250 MG IJ SOLR
250.0000 mg | Freq: Once | INTRAMUSCULAR | Status: AC
  Administered 2018-04-03: 250 mg via INTRAMUSCULAR
  Filled 2018-04-03: qty 250

## 2018-04-03 MED ORDER — AZITHROMYCIN 250 MG PO TABS
1000.0000 mg | ORAL_TABLET | Freq: Once | ORAL | Status: AC
  Administered 2018-04-03: 1000 mg via ORAL
  Filled 2018-04-03: qty 4

## 2018-04-03 NOTE — Discharge Instructions (Addendum)

## 2018-04-03 NOTE — ED Provider Notes (Signed)
[facility] EMERGENCY DEPARTMENT Provider Note   CSN: [id number] Arrival date & time: 04/03/18  2121     History   Chief Complaint Chief Complaint  Patient presents with  . Penile Discharge    HPI Derek Moreno is a 32 y.o. male.  HPI 32 year old male here with penile discharge.  The patient states that he was recently told by a ex-significant other that she was diagnosed with STDs and that he should be checked out.  He states that for the last 2 weeks, which is the last time he was sexually active with that partner, he has had yellow-white penile discharge.  He said associated mild pain with urination.  He denies any sores or lesions.  No testicular pain or swelling.  No fevers, chills, weight loss, night sweats.  He has not noticed any enlarged lymph nodes or new areas of swelling.  He declines HIV testing.  He states he has not been sexually active with anyone in the last 2 weeks, other than that partner, which he notes has been treated.  Of note, he was last seen in August for similar symptoms and had a positive chlamydia.  He was appropriately treated at that time.  Past Medical History:  Diagnosis Date  . Asthma     Patient Active Problem List   Diagnosis Date Noted  . Right flank pain 11/12/2015  . Dysuria 11/12/2015  . Pain in right thigh 11/12/2015  . Appendicolith 11/12/2015  . Tobacco abuse 11/12/2015    History reviewed. No pertinent surgical history.      Home Medications    Prior to Admission medications   Medication Sig Start Date End Date Taking? Authorizing Provider  albuterol (PROVENTIL HFA;VENTOLIN HFA) 108 (90 Base) MCG/ACT inhaler Inhale into the lungs every 6 (six) hours as needed for wheezing or shortness of breath.   Yes [provider]    Family History No family history on file.  Social History Social History   Tobacco Use  . Smoking status: Former Smoker    Packs/day: 1.00  . Smokeless tobacco: Never Used    Substance Use Topics  . Alcohol use: No  . Drug use: Yes    Types: Marijuana    Comment: occasional     Allergies   Patient has no known allergies.   Review of Systems Review of Systems  Constitutional: Negative for chills, fatigue and fever.  HENT: Negative for congestion and rhinorrhea.   Eyes: Negative for visual disturbance.  Respiratory: Negative for cough, shortness of breath and wheezing.   Cardiovascular: Negative for chest pain and leg swelling.  Gastrointestinal: Negative for abdominal pain, diarrhea, nausea and vomiting.  Genitourinary: Positive for discharge and dysuria. Negative for flank pain.  Musculoskeletal: Negative for neck pain and neck stiffness.  Skin: Negative for rash and wound.  Allergic/Immunologic: Negative for immunocompromised state.  Neurological: Negative for syncope, weakness, numbness and headaches.  Hematological: Does not bruise/bleed easily.  All other systems reviewed and are negative.    Physical Exam Updated Vital Signs BP (!) 141/79   Pulse 81   Temp 98.1 F (36.7 C) (Oral)   Resp 16   Ht 5\' 9"  (1.753 m)   Wt 61.2 kg   SpO2 100%   BMI 19.94 kg/m   Physical Exam  Constitutional: He is oriented to person, place, and time. He appears well-developed and well-nourished. No distress.  HENT:  Head: Normocephalic and atraumatic.  Eyes: Conjunctivae are normal.  Neck: Neck supple.  Cardiovascular:  Normal rate, regular rhythm and normal heart sounds.  Pulmonary/Chest: Effort normal. No respiratory distress. He has no wheezes.  Abdominal: He exhibits no distension.  Genitourinary:  Genitourinary Comments: Mild yellow-white penile discharge.  No penile lesions or sores.  No testicular pain or swelling.  Testes descended bilaterally.  No lymphadenopathy.  Musculoskeletal: He exhibits no edema.  Neurological: He is alert and oriented to person, place, and time. He exhibits normal muscle tone.  Skin: Skin is warm. Capillary refill  takes less than 2 seconds. No rash noted.  Nursing note and vitals reviewed.    ED Treatments / Results  Labs (all labs ordered are listed, but only abnormal results are displayed) Labs Reviewed  URINALYSIS, ROUTINE W REFLEX MICROSCOPIC - Abnormal; Notable for the following components:      Result Value   Hgb urine dipstick SMALL (*)    Leukocytes, UA SMALL (*)    All other components within normal limits  URINALYSIS, MICROSCOPIC (REFLEX) - Abnormal; Notable for the following components:   Bacteria, UA FEW (*)    All other components within normal limits  GC/CHLAMYDIA PROBE AMP ([place]) NOT AT [facility]    EKG None  Radiology No results found.  Procedures Procedures (including critical care time)  Medications Ordered in ED Medications  cefTRIAXone (ROCEPHIN) injection 250 mg (250 mg Intramuscular Given 04/03/18 2216)  azithromycin (ZITHROMAX) tablet 1,000 mg (1,000 mg Oral Given 04/03/18 2215)  metroNIDAZOLE (FLAGYL) tablet 2,000 mg (2,000 mg Oral Given 04/03/18 2215)     Initial Impression / Assessment and Plan / ED Course  I have reviewed the triage vital signs and the nursing notes.  Pertinent labs & imaging results that were available during my care of the patient were reviewed by me and considered in my medical decision making (see chart for details).  Clinical Course as of Apr 03 2229  Mon Apr 03, 2018  2149 Mucus: PRESENT [CI]    Clinical Course User Index [CI] Shaune Pollack, MD    32 year old male here with STD exposure and penile discharge.  I suspect likely recurrent STD.  He declines HIV or RPR testing.  He is afebrile and well-appearing without signs of systemic illness or sepsis.  No lesions to suggest sepsis.  No source or signs of herpes.  Patient empirically treated and will discharge home.  Safe sex precautions given.  Final Clinical Impressions(s) / ED Diagnoses   Final diagnoses:  STD exposure    ED Discharge Orders    None         Shaune Pollack, MD 04/03/18 2230

## 2018-04-03 NOTE — ED Triage Notes (Signed)
Penile discharge.

## 2018-04-05 LAB — GC/CHLAMYDIA PROBE AMP (~~LOC~~) NOT AT ARMC
Chlamydia: POSITIVE — AB
Neisseria Gonorrhea: POSITIVE — AB

## 2018-04-11 ENCOUNTER — Other Ambulatory Visit: Payer: Self-pay

## 2018-04-11 ENCOUNTER — Emergency Department (HOSPITAL_BASED_OUTPATIENT_CLINIC_OR_DEPARTMENT_OTHER): Payer: BLUE CROSS/BLUE SHIELD

## 2018-04-11 ENCOUNTER — Emergency Department (HOSPITAL_BASED_OUTPATIENT_CLINIC_OR_DEPARTMENT_OTHER)
Admission: EM | Admit: 2018-04-11 | Discharge: 2018-04-11 | Disposition: A | Discharge status: Home / Self Care | Payer: BLUE CROSS/BLUE SHIELD | Attending: Emergency Medicine | Admitting: Emergency Medicine

## 2018-04-11 ENCOUNTER — Encounter (HOSPITAL_BASED_OUTPATIENT_CLINIC_OR_DEPARTMENT_OTHER): Payer: Self-pay

## 2018-04-11 DIAGNOSIS — J45909 Unspecified asthma, uncomplicated: Secondary | ICD-10-CM | POA: Diagnosis not present

## 2018-04-11 DIAGNOSIS — Y939 Activity, unspecified: Secondary | ICD-10-CM | POA: Insufficient documentation

## 2018-04-11 DIAGNOSIS — Y929 Unspecified place or not applicable: Secondary | ICD-10-CM | POA: Diagnosis not present

## 2018-04-11 DIAGNOSIS — S61412A Laceration without foreign body of left hand, initial encounter: Secondary | ICD-10-CM | POA: Insufficient documentation

## 2018-04-11 DIAGNOSIS — Y999 Unspecified external cause status: Secondary | ICD-10-CM | POA: Diagnosis not present

## 2018-04-11 DIAGNOSIS — Z87891 Personal history of nicotine dependence: Secondary | ICD-10-CM | POA: Insufficient documentation

## 2018-04-11 DIAGNOSIS — Z23 Encounter for immunization: Secondary | ICD-10-CM | POA: Diagnosis not present

## 2018-04-11 DIAGNOSIS — W260XXA Contact with knife, initial encounter: Secondary | ICD-10-CM | POA: Diagnosis not present

## 2018-04-11 MED ORDER — TETANUS-DIPHTH-ACELL PERTUSSIS 5-2.5-18.5 LF-MCG/0.5 IM SUSP
0.5000 mL | Freq: Once | INTRAMUSCULAR | Status: AC
  Administered 2018-04-11: 0.5 mL via INTRAMUSCULAR
  Filled 2018-04-11: qty 0.5

## 2018-04-11 MED ORDER — ACETAMINOPHEN 325 MG PO TABS
650.0000 mg | ORAL_TABLET | Freq: Once | ORAL | Status: AC
  Administered 2018-04-11: 650 mg via ORAL
  Filled 2018-04-11: qty 2

## 2018-04-11 MED ORDER — CEPHALEXIN 500 MG PO CAPS: 500.0000 mg | ORAL_CAPSULE | Freq: Four times a day (QID) | ORAL | 0 refills | Status: AC

## 2018-04-11 MED ORDER — NAPROXEN 500 MG PO TABS: 500.0000 mg | ORAL_TABLET | Freq: Two times a day (BID) | ORAL | 0 refills | Status: DC

## 2018-04-11 NOTE — Discharge Instructions (Signed)
Your wound was outside the timeframe for closure.  I have provided you with antibiotics to prevent infection. Please take all of your antibiotics until finished!   You may develop abdominal discomfort or diarrhea from the antibiotic.  You may help offset this with probiotics which you can buy or get in yogurt. Do not eat or take the probiotics until 2 hours after your antibiotic. Do not take your medicine if develop an itchy rash, swelling in your mouth or lips, or difficulty breathing.  Follow attached handout.  Follow up with your primary care doctor If you develop worsening or new concerning symptoms you can return to the emergency department for re-evaluation.
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
## 2018-05-22 ENCOUNTER — Ambulatory Visit: Payer: BLUE CROSS/BLUE SHIELD | Admitting: Family Medicine

## 2018-05-22 DIAGNOSIS — Z0289 Encounter for other administrative examinations: Secondary | ICD-10-CM

## 2018-12-05 ENCOUNTER — Emergency Department (HOSPITAL_BASED_OUTPATIENT_CLINIC_OR_DEPARTMENT_OTHER)
Admission: EM | Admit: 2018-12-05 | Discharge: 2018-12-05 | Disposition: A | Discharge status: Home / Self Care | Payer: Self-pay | Attending: Emergency Medicine | Admitting: Emergency Medicine

## 2018-12-05 ENCOUNTER — Encounter (HOSPITAL_BASED_OUTPATIENT_CLINIC_OR_DEPARTMENT_OTHER): Payer: Self-pay

## 2018-12-05 ENCOUNTER — Other Ambulatory Visit: Payer: Self-pay

## 2018-12-05 DIAGNOSIS — J45909 Unspecified asthma, uncomplicated: Secondary | ICD-10-CM | POA: Insufficient documentation

## 2018-12-05 DIAGNOSIS — Z87891 Personal history of nicotine dependence: Secondary | ICD-10-CM | POA: Insufficient documentation

## 2018-12-05 DIAGNOSIS — Z202 Contact with and (suspected) exposure to infections with a predominantly sexual mode of transmission: Secondary | ICD-10-CM | POA: Insufficient documentation

## 2018-12-05 MED ORDER — CEFTRIAXONE SODIUM 250 MG IJ SOLR
250.0000 mg | Freq: Once | INTRAMUSCULAR | Status: AC
  Administered 2018-12-05: 250 mg via INTRAMUSCULAR
  Filled 2018-12-05: qty 250

## 2018-12-05 MED ORDER — AZITHROMYCIN 1 G PO PACK
1.0000 g | PACK | Freq: Once | ORAL | Status: AC
  Administered 2018-12-05: 1 g via ORAL
  Filled 2018-12-05: qty 1

## 2018-12-05 NOTE — ED Provider Notes (Signed)
[facility] EMERGENCY DEPARTMENT Provider Note   CSN: [id number] Arrival date & time: 12/05/18  0012 [id number]   History   Chief Complaint Chief Complaint  Patient presents with  . Exposure to STD    HPI Derek Moreno is a 33 y.o. male.     The history is provided by the patient.  Exposure to STD  This is a recurrent problem. The current episode started more than 1 week ago. The problem occurs constantly. The problem has not changed since onset.Pertinent negatives include no chest pain, no abdominal pain, no headaches and no shortness of breath. Nothing aggravates the symptoms. Nothing relieves the symptoms. He has tried nothing for the symptoms. The treatment provided no relief.  Told by partner she has been with others and is getting tested.  States he has had some discharge.    Past Medical History:  Diagnosis Date  . Asthma     Patient Active Problem List   Diagnosis Date Noted  . Right flank pain 11/12/2015  . Dysuria 11/12/2015  . Pain in right thigh 11/12/2015  . Appendicolith 11/12/2015  . Tobacco abuse 11/12/2015    History reviewed. No pertinent surgical history.      Home Medications    Prior to Admission medications   Medication Sig Start Date End Date Taking? Authorizing Provider  albuterol (PROVENTIL HFA;VENTOLIN HFA) 108 (90 Base) MCG/ACT inhaler Inhale into the lungs every 6 (six) hours as needed for wheezing or shortness of breath.    [provider]  cephALEXin (KEFLEX) 500 MG capsule Take 1 capsule (500 mg total) by mouth 4 (four) times daily. 04/11/18   Maczis, Elmer SowMichael M, PA-C  naproxen (NAPROSYN) 500 MG tablet Take 1 tablet (500 mg total) by mouth 2 (two) times daily. 04/11/18   Maczis, Elmer SowMichael M, PA-C    Family History No family history on file.  Social History Social History   Tobacco Use  . Smoking status: Former Smoker    Packs/day: 1.00  . Smokeless tobacco: Never Used  Substance Use Topics  . Alcohol use: No  . Drug  use: Yes    Types: Marijuana    Comment: occasional     Allergies   Patient has no known allergies.   Review of Systems Review of Systems  Constitutional: Negative for fever.  Respiratory: Negative for cough and shortness of breath.   Cardiovascular: Negative for chest pain.  Gastrointestinal: Negative for abdominal pain.  Genitourinary: Positive for discharge.  Neurological: Negative for headaches.  All other systems reviewed and are negative.    Physical Exam Updated Vital Signs BP 126/84   Pulse 71   Temp 98.4 F (36.9 C) (Oral)   Resp 17   Ht 5\' 9"  (1.753 m)   Wt 61.2 kg   SpO2 99%   BMI 19.94 kg/m   Physical Exam Vitals signs and nursing note reviewed.  Constitutional:      General: He is not in acute distress.    Appearance: He is normal weight.  HENT:     Head: Normocephalic and atraumatic.     Nose: Nose normal.  Eyes:     Conjunctiva/sclera: Conjunctivae normal.     Pupils: Pupils are equal, round, and reactive to light.  Neck:     Musculoskeletal: Normal range of motion and neck supple.  Cardiovascular:     Rate and Rhythm: Normal rate and regular rhythm.     Pulses: Normal pulses.     Heart sounds: Normal heart sounds.  Pulmonary:     Effort: Pulmonary effort is normal.     Breath sounds: Normal breath sounds.  Abdominal:     General: Abdomen is flat. Bowel sounds are normal.     Tenderness: There is no abdominal tenderness. There is no guarding.  Genitourinary:    Penis: Normal.      Comments: Chaperone present  Musculoskeletal: Normal range of motion.  Skin:    General: Skin is warm and dry.     Capillary Refill: Capillary refill takes less than 2 seconds.  Neurological:     General: No focal deficit present.     Mental Status: He is alert and oriented to person, place, and time.  Psychiatric:        Mood and Affect: Mood normal.        Behavior: Behavior normal.      ED Treatments / Results  Labs (all labs ordered are listed,  but only abnormal results are displayed) Labs Reviewed  GC/CHLAMYDIA PROBE AMP ([place]) NOT AT [facility]    EKG None  Radiology No results found.  Procedures Procedures (including critical care time)  Medications Ordered in ED Medications  cefTRIAXone (ROCEPHIN) injection 250 mg (has no administration in time range)  azithromycin (ZITHROMAX) powder 1 g (has no administration in time range)       Final Clinical Impressions(s) / ED Diagnoses   Return for intractable cough, coughing up blood,fevers >100.4 unrelieved by medication, shortness of breath, intractable vomiting, chest pain, shortness of breath, weakness,numbness, changes in speech, facial asymmetry,abdominal pain, passing out,Inability to tolerate liquids or food, cough, altered mental status or any concerns. No signs of systemic illness or infection. The patient is nontoxic-appearing on exam and vital signs are within normal limits.   I have reviewed the triage vital signs and the nursing notes. Pertinent labs &imaging results that were available during my care of the patient were reviewed by me and considered in my medical decision making (see chart for details).  After history, exam, and medical workup I feel the patient has been appropriately medically screened and is safe for discharge home. Pertinent diagnoses were discussed with the patient. Patient was given return precautions   Sebert Stollings, MD 12/05/18 [id number]
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
## 2018-12-07 LAB — GC/CHLAMYDIA PROBE AMP (~~LOC~~) NOT AT ARMC
Chlamydia: NEGATIVE
Neisseria Gonorrhea: NEGATIVE

## 2019-03-26 ENCOUNTER — Emergency Department (HOSPITAL_BASED_OUTPATIENT_CLINIC_OR_DEPARTMENT_OTHER)
Admission: EM | Admit: 2019-03-26 | Discharge: 2019-03-26 | Disposition: A | Discharge status: Home / Self Care | Payer: Self-pay | Attending: Emergency Medicine | Admitting: Emergency Medicine

## 2019-03-26 ENCOUNTER — Other Ambulatory Visit: Payer: Self-pay

## 2019-03-26 ENCOUNTER — Encounter (HOSPITAL_BASED_OUTPATIENT_CLINIC_OR_DEPARTMENT_OTHER): Payer: Self-pay | Admitting: *Deleted

## 2019-03-26 DIAGNOSIS — K6289 Other specified diseases of anus and rectum: Secondary | ICD-10-CM | POA: Insufficient documentation

## 2019-03-26 DIAGNOSIS — Z5321 Procedure and treatment not carried out due to patient leaving prior to being seen by health care provider: Secondary | ICD-10-CM | POA: Insufficient documentation

## 2019-03-26 NOTE — ED Notes (Signed)
Seen walking out of department with visitor.

## 2019-03-26 NOTE — ED Triage Notes (Signed)
Pt reports rectal pain with BM and blood on tissue after BM x 2 days. Denies abdominal pain.

## 2019-03-28 ENCOUNTER — Encounter (HOSPITAL_BASED_OUTPATIENT_CLINIC_OR_DEPARTMENT_OTHER): Payer: Self-pay

## 2019-03-28 ENCOUNTER — Emergency Department (HOSPITAL_BASED_OUTPATIENT_CLINIC_OR_DEPARTMENT_OTHER)
Admission: EM | Admit: 2019-03-28 | Discharge: 2019-03-28 | Discharge status: Against Medical Advice | Payer: Self-pay | Attending: Emergency Medicine | Admitting: Emergency Medicine

## 2019-03-28 ENCOUNTER — Emergency Department (HOSPITAL_BASED_OUTPATIENT_CLINIC_OR_DEPARTMENT_OTHER): Payer: Self-pay

## 2019-03-28 ENCOUNTER — Other Ambulatory Visit: Payer: Self-pay

## 2019-03-28 DIAGNOSIS — M79675 Pain in left toe(s): Secondary | ICD-10-CM | POA: Diagnosis not present

## 2019-03-28 DIAGNOSIS — S161XXA Strain of muscle, fascia and tendon at neck level, initial encounter: Secondary | ICD-10-CM | POA: Diagnosis not present

## 2019-03-28 DIAGNOSIS — Y9389 Activity, other specified: Secondary | ICD-10-CM | POA: Diagnosis not present

## 2019-03-28 DIAGNOSIS — Y999 Unspecified external cause status: Secondary | ICD-10-CM | POA: Diagnosis not present

## 2019-03-28 DIAGNOSIS — Z79899 Other long term (current) drug therapy: Secondary | ICD-10-CM | POA: Diagnosis not present

## 2019-03-28 DIAGNOSIS — Z765 Malingerer [conscious simulation]: Secondary | ICD-10-CM | POA: Diagnosis not present

## 2019-03-28 DIAGNOSIS — Z87891 Personal history of nicotine dependence: Secondary | ICD-10-CM | POA: Diagnosis not present

## 2019-03-28 DIAGNOSIS — Y92410 Unspecified street and highway as the place of occurrence of the external cause: Secondary | ICD-10-CM | POA: Insufficient documentation

## 2019-03-28 DIAGNOSIS — J45909 Unspecified asthma, uncomplicated: Secondary | ICD-10-CM | POA: Insufficient documentation

## 2019-03-28 DIAGNOSIS — S199XXA Unspecified injury of neck, initial encounter: Secondary | ICD-10-CM | POA: Diagnosis present

## 2019-03-28 MED ORDER — ACETAMINOPHEN 500 MG PO TABS
1000.0000 mg | ORAL_TABLET | Freq: Once | ORAL | Status: AC
  Administered 2019-03-28: 1000 mg via ORAL
  Filled 2019-03-28: qty 2

## 2019-03-28 MED ORDER — IBUPROFEN 800 MG PO TABS
800.0000 mg | ORAL_TABLET | Freq: Once | ORAL | Status: AC
  Administered 2019-03-28: 19:00:00 800 mg via ORAL
  Filled 2019-03-28: qty 1

## 2019-03-28 NOTE — ED Notes (Signed)
Patient transported to X-ray 
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
## 2019-03-28 NOTE — ED Notes (Signed)
Pt yelling and cursing at nursing staff. Pt demanding to be discharged and more pain medications. Informed pt that as soon as his xrays resulted MD would discharge him.  Asked pt several times to stop yelling and cursing at staff. Pt continued to yell and curse. Security called and pt escorted out of department. Pt ambulatory without difficulty.

## 2019-03-28 NOTE — ED Notes (Signed)
ED Provider at bedside. 

## 2019-03-28 NOTE — ED Notes (Signed)
Pt wanting "something stronger for pain." Advised pt we were waiting on xray results.
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
## 2020-11-30 ENCOUNTER — Other Ambulatory Visit: Payer: Self-pay

## 2020-11-30 ENCOUNTER — Emergency Department (HOSPITAL_BASED_OUTPATIENT_CLINIC_OR_DEPARTMENT_OTHER)
Admission: EM | Admit: 2020-11-30 | Discharge: 2020-11-30 | Disposition: A | Discharge status: Home / Self Care | Payer: No Typology Code available for payment source | Attending: Emergency Medicine | Admitting: Emergency Medicine

## 2020-11-30 ENCOUNTER — Encounter (HOSPITAL_BASED_OUTPATIENT_CLINIC_OR_DEPARTMENT_OTHER): Payer: Self-pay

## 2020-11-30 ENCOUNTER — Emergency Department (HOSPITAL_BASED_OUTPATIENT_CLINIC_OR_DEPARTMENT_OTHER): Payer: No Typology Code available for payment source

## 2020-11-30 DIAGNOSIS — J45909 Unspecified asthma, uncomplicated: Secondary | ICD-10-CM | POA: Diagnosis not present

## 2020-11-30 DIAGNOSIS — Z87891 Personal history of nicotine dependence: Secondary | ICD-10-CM | POA: Insufficient documentation

## 2020-11-30 DIAGNOSIS — M79641 Pain in right hand: Secondary | ICD-10-CM | POA: Diagnosis not present

## 2020-11-30 DIAGNOSIS — Y92410 Unspecified street and highway as the place of occurrence of the external cause: Secondary | ICD-10-CM | POA: Diagnosis not present

## 2020-11-30 DIAGNOSIS — M542 Cervicalgia: Secondary | ICD-10-CM | POA: Insufficient documentation

## 2020-11-30 MED ORDER — IBUPROFEN 600 MG PO TABS: 600.0000 mg | ORAL_TABLET | Freq: Four times a day (QID) | ORAL | 0 refills | Status: DC | PRN

## 2020-11-30 MED ORDER — CYCLOBENZAPRINE HCL 10 MG PO TABS: 10.0000 mg | ORAL_TABLET | Freq: Two times a day (BID) | ORAL | 0 refills | Status: DC | PRN
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
## 2020-12-25 ENCOUNTER — Other Ambulatory Visit (HOSPITAL_COMMUNITY): Payer: Self-pay

## 2024-05-14 ENCOUNTER — Emergency Department (HOSPITAL_BASED_OUTPATIENT_CLINIC_OR_DEPARTMENT_OTHER): Payer: Self-pay

## 2024-05-14 ENCOUNTER — Encounter (HOSPITAL_BASED_OUTPATIENT_CLINIC_OR_DEPARTMENT_OTHER): Payer: Self-pay

## 2024-05-14 ENCOUNTER — Emergency Department (HOSPITAL_BASED_OUTPATIENT_CLINIC_OR_DEPARTMENT_OTHER)
Admission: EM | Admit: 2024-05-14 | Discharge: 2024-05-14 | Disposition: A | Discharge status: Home / Self Care | Payer: Self-pay | Attending: Emergency Medicine | Admitting: Emergency Medicine

## 2024-05-14 ENCOUNTER — Other Ambulatory Visit: Payer: Self-pay

## 2024-05-14 DIAGNOSIS — T7411XA Adult physical abuse, confirmed, initial encounter: Secondary | ICD-10-CM | POA: Insufficient documentation

## 2024-05-14 DIAGNOSIS — M79621 Pain in right upper arm: Secondary | ICD-10-CM | POA: Insufficient documentation

## 2024-05-14 DIAGNOSIS — M542 Cervicalgia: Secondary | ICD-10-CM | POA: Insufficient documentation

## 2024-05-14 MED ORDER — LIDOCAINE 5 % EX PTCH: 1.0000 | MEDICATED_PATCH | CUTANEOUS | 0 refills | Status: AC

## 2024-05-14 MED ORDER — NAPROXEN 500 MG PO TABS: 500.0000 mg | ORAL_TABLET | Freq: Two times a day (BID) | ORAL | 0 refills | Status: AC

## 2024-05-14 MED ORDER — METHOCARBAMOL 500 MG PO TABS: 500.0000 mg | ORAL_TABLET | Freq: Two times a day (BID) | ORAL | 0 refills | Status: AC

## 2024-05-14 NOTE — Discharge Instructions (Addendum)
 It was a pleasure taking care of you here today  We have written you for a few medications to help with your symptom  Make sure to follow-up outpatient, return for new or worsening symptoms
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
## 2024-05-14 NOTE — ED Notes (Signed)
 Patient transferred from waiting room to ED treatment room. Assuming pt care at this time.

## 2024-05-14 NOTE — ED Notes (Signed)
Patient transported to imaging at this time.
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
## 2024-06-14 NOTE — ED Triage Notes (Signed)
 Neck, back, and right hand pain after work injury.  Ambulatory with steady gait.
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
## 2024-07-11 ENCOUNTER — Encounter (HOSPITAL_BASED_OUTPATIENT_CLINIC_OR_DEPARTMENT_OTHER): Payer: Self-pay

## 2024-07-11 ENCOUNTER — Emergency Department (HOSPITAL_BASED_OUTPATIENT_CLINIC_OR_DEPARTMENT_OTHER)
Admission: EM | Admit: 2024-07-11 | Discharge: 2024-07-11 | Disposition: A | Discharge status: Home / Self Care | Payer: Self-pay | Attending: Emergency Medicine | Admitting: Emergency Medicine

## 2024-07-11 ENCOUNTER — Other Ambulatory Visit: Payer: Self-pay

## 2024-07-11 DIAGNOSIS — S161XXA Strain of muscle, fascia and tendon at neck level, initial encounter: Secondary | ICD-10-CM | POA: Insufficient documentation

## 2024-07-11 DIAGNOSIS — S0990XA Unspecified injury of head, initial encounter: Secondary | ICD-10-CM | POA: Insufficient documentation

## 2024-07-11 DIAGNOSIS — W228XXA Striking against or struck by other objects, initial encounter: Secondary | ICD-10-CM | POA: Insufficient documentation

## 2024-07-11 DIAGNOSIS — Z79899 Other long term (current) drug therapy: Secondary | ICD-10-CM | POA: Insufficient documentation

## 2024-07-11 MED ORDER — LIDOCAINE 5 % EX PTCH
1.0000 | MEDICATED_PATCH | CUTANEOUS | Status: DC
  Administered 2024-07-11: 1 via TRANSDERMAL
  Filled 2024-07-11: qty 1

## 2024-07-11 MED ORDER — HYDROCODONE-ACETAMINOPHEN 5-325 MG PO TABS: 1.0000 | ORAL_TABLET | ORAL | 0 refills | Status: AC | PRN

## 2024-07-11 MED ORDER — OXYCODONE-ACETAMINOPHEN 5-325 MG PO TABS
1.0000 | ORAL_TABLET | Freq: Once | ORAL | Status: AC
  Administered 2024-07-11: 1 via ORAL
  Filled 2024-07-11: qty 1

## 2024-07-11 NOTE — ED Notes (Signed)
 RN provided AVS using Teachback Method. Patient verbalizes understanding of Discharge Instructions. Opportunity for Questioning and Answers were provided by RN. Patient Discharged from ED ambulatory to home via self.
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
